# Patient Record
Sex: Male | Born: 1975 | Race: White | Hispanic: No | Marital: Married | Smoking: Never smoker
Health system: Southern US, Community
[De-identification: ages and names within clinical notes are randomized; demographics above are authoritative.]

---

## 2018-12-13 ENCOUNTER — Encounter (HOSPITAL_COMMUNITY): Payer: Self-pay | Admitting: Psychiatry

## 2018-12-13 ENCOUNTER — Other Ambulatory Visit (HOSPITAL_COMMUNITY): Payer: Self-pay | Admitting: Psychiatry

## 2018-12-13 ENCOUNTER — Ambulatory Visit (INDEPENDENT_AMBULATORY_CARE_PROVIDER_SITE_OTHER): Payer: BC Managed Care – PPO | Admitting: Psychiatry

## 2018-12-13 ENCOUNTER — Other Ambulatory Visit: Payer: Self-pay

## 2018-12-13 DIAGNOSIS — F3130 Bipolar disorder, current episode depressed, mild or moderate severity, unspecified: Secondary | ICD-10-CM | POA: Diagnosis not present

## 2018-12-13 DIAGNOSIS — K219 Gastro-esophageal reflux disease without esophagitis: Secondary | ICD-10-CM | POA: Diagnosis not present

## 2018-12-13 MED ORDER — LORAZEPAM 0.5 MG PO TABS: ORAL_TABLET | ORAL | 2 refills | Status: DC

## 2018-12-13 MED ORDER — OXCARBAZEPINE 150 MG PO TABS: 450.0000 mg | ORAL_TABLET | Freq: Two times a day (BID) | ORAL | 0 refills | Status: DC

## 2018-12-13 NOTE — Progress Notes (Signed)
BH MD/PA/NP OP Progress Note  12/13/2018 9:12 AM Travis Whitney  MRN:  [id number]  Chief Complaint:  Chief Complaint    Manic Behavior; Depression; Anxiety    Bipolar disorder HPI: Patient is seen and examined by phone.  Patient is a 43 year old male with a past psychiatric history significant for bipolar disorder.  I have followed the patient for the last 2 to 3 years.  He was last seen in [place] on 08/06/18. He contacted me by phone approximately 2 to 3 weeks ago, and was having a depressive episode.  He was currently taking Wellbutrin XL, Lamictal, and Trileptal. Adjustments were made in meds including. Ativan was added for prn sleep issues. Patient reports a big change.  He stated his mood was better and his sleep was improved.  He stated he did not use the Ativan most recently, and it has only been just as needed.  He denied any suicidal or homicidal ideation.  He denied any racing thoughts or pressured speech.  He stated that overall things were much better.  He denied any side effects to his current medications.  Visit Diagnosis: No diagnosis found.  Past Psychiatric History: My outpatient x 2 years. One previous hospitalization.  Past Medical History: History reviewed. No pertinent past medical history. History reviewed. No pertinent surgical history. History significant for GERD  Family Psychiatric History: non contributory  Family History: History reviewed. No pertinent family history.  Social History:  Social History   Socioeconomic History  . Marital status: Married    Spouse name: Not on file  . Number of children: Not on file  . Years of education: Not on file  . Highest education level: Not on file  Occupational History  . Not on file  Social Needs  . Financial resource strain: Not on file  . Food insecurity    Worry: Not on file    Inability: Not on file  . Transportation needs    Medical: Not on file    Non-medical: Not on file  Tobacco Use  . Smoking status:  Not on file  Substance and Sexual Activity  . Alcohol use: Not on file  . Drug use: Not on file  . Sexual activity: Not on file  Lifestyle  . Physical activity    Days per week: Not on file    Minutes per session: Not on file  . Stress: Not on file  Relationships  . Social Musicianconnections    Talks on phone: Not on file    Gets together: Not on file    Attends religious service: Not on file    Active member of club or organization: Not on file    Attends meetings of clubs or organizations: Not on file    Relationship status: Not on file  Other Topics Concern  . Not on file  Social History Narrative  . Not on file    Allergies: Not on File  Metabolic Disorder Labs: No results found for: HGBA1C, MPG No results found for: PROLACTIN No results found for: CHOL, TRIG, HDL, CHOLHDL, VLDL, LDLCALC No results found for: TSH  Therapeutic Level Labs: No results found for: LITHIUM No results found for: VALPROATE No components found for:  CBMZ  Current Medications: Current Outpatient Medications  Medication Sig Dispense Refill  . famotidine (PEPCID) 20 MG tablet TK 1 T PO BID PRN    . OXcarbazepine (TRILEPTAL) 150 MG tablet TK 3 TS PO QD AND 2 TS QHS    . pantoprazole (PROTONIX)  40 MG tablet TK 1 T PO QD    . buPROPion (WELLBUTRIN) 75 MG tablet TAKE 2 TABLET BY MOUTH EVERY DAY AND 1 TABLET BY MOUTH EVERY NIGHT    . lamoTRIgine (LAMICTAL) 200 MG tablet TK 1 T PO BID    . LORazepam (ATIVAN) 0.5 MG tablet TK 1 TO 2 TS PO HS PRF INSOMNIA     No current facility-administered medications for this visit.      Musculoskeletal: Strength & Muscle Tone: within normal limits Gait & Station: normal Patient leans: N/A  Psychiatric Specialty Exam: ROS  There were no vitals taken for this visit.There is no height or weight on file to calculate BMI.  General Appearance: NA  Eye Contact:  NA  Speech:  Normal Rate  Volume:  Normal  Mood:  Euthymic  Affect:  Congruent  Thought Process:   Coherent and Descriptions of Associations: Intact  Orientation:  Full (Time, Place, and Person)  Thought Content: Logical   Suicidal Thoughts:  No  Homicidal Thoughts:  No  Memory:  Immediate;   Fair Recent;   Fair Remote;   Fair  Judgement:  Intact  Insight:  Fair  Psychomotor Activity:  NA  Concentration:  Concentration: Good and Attention Span: Good  Recall:  Good  Fund of Knowledge: Good  Language: Good  Akathisia:  Negative  Handed:  Right  AIMS (if indicated): not done  Assets:  Communication Skills Desire for Improvement Housing Leisure Time Physical Health Resilience Social Support  ADL's:  Intact  Cognition: WNL  Sleep:  Good   Screenings:   Assessment and Plan: Patient is seen and examined.  Patient is a 43 year old male with a past psychiatric history significant for bipolar disorder; most recently mixed, moderate to severe without psychotic features.  He is doing much better with the medication changes that have been done.  He is currently taking Trileptal 450 mg p.o. twice daily, Lamictal 200 mg p.o. twice daily and has reduced his Wellbutrin dosage as well.  No change in his current medications.  He will return to the clinic in approximately 1 month. 1.  Continue Trileptal 450 mg p.o. twice daily for mood stability 2.  Continue Lamictal 200 mg p.o. twice daily for mood stability. 3.  Wellbutrin short acting for depression. 4.  Continue Protonix and Pepcid for GERD symptoms. 5.  Continue ativan 0.5 mg po q hs prn insomnia 6. Return to clinic in 1 month.   Sharma Covert, MD 12/13/2018, 9:12 AM

## 2019-01-03 ENCOUNTER — Ambulatory Visit (HOSPITAL_COMMUNITY): Payer: BC Managed Care – PPO | Admitting: Psychiatry

## 2019-03-10 ENCOUNTER — Other Ambulatory Visit (HOSPITAL_COMMUNITY): Payer: Self-pay | Admitting: Psychiatry

## 2019-03-10 MED ORDER — LORAZEPAM 0.5 MG PO TABS: ORAL_TABLET | ORAL | 2 refills | Status: DC

## 2019-04-14 ENCOUNTER — Other Ambulatory Visit (HOSPITAL_COMMUNITY): Payer: Self-pay | Admitting: Psychiatry

## 2019-04-14 MED ORDER — QUETIAPINE FUMARATE 50 MG PO TABS: ORAL_TABLET | ORAL | 2 refills | Status: DC

## 2019-04-18 ENCOUNTER — Other Ambulatory Visit: Payer: Self-pay

## 2019-04-18 ENCOUNTER — Ambulatory Visit (INDEPENDENT_AMBULATORY_CARE_PROVIDER_SITE_OTHER): Payer: BC Managed Care – PPO | Admitting: Psychiatry

## 2019-04-18 DIAGNOSIS — F3130 Bipolar disorder, current episode depressed, mild or moderate severity, unspecified: Secondary | ICD-10-CM

## 2019-04-18 NOTE — Progress Notes (Signed)
BH MD/PA/NP OP Progress Note  04/18/2019 8:56 AM Travis Whitney  MRN:  [id number]  Chief Complaint: Bipolar disorder, most recently depressed HPI: Patient is seen and examined by telephone visit today.  Patient is a 43 year old male well-known to me from my previous practice in [facility] with a history of bipolar disorder.  The patient called last week, was having a difficult time with sleep.  He had increased recent stressors secondary to his workplace as well as the fact that his mother is been hospitalized in [place] with coronavirus.  She has been there for over 20 days.  They have recommended intubation and ventilation at some point, but she is refused at this point and it sounds like she is on BiPAP.  Unfortunately they had to place a chest tube recently.  He has been unable to visit his mother because of the rules at the hospital over coronavirus issues.  Last week we started Seroquel 50 mg 1-2 tabs p.o. nightly because of the lack of sleep and concern for bipolar depression.  Shortly after he started this we discussed it over the telephone, and he was sleeping better with just 50 mg a day.  He stated he had not required the Ativan to help him sleep.  Today he stated he is still sleeping well, but somewhat hung over in the morning.  We discussed medication changes.  He stated his mood was a little bit better than it had been last week because the improvement in sleep.  He is matter-of-fact about his mother's condition, and is hoping that she recovers but she does have an underlying lung disease problem.  He denied any suicidal ideation at this point.  He also remains on Trileptal, Lamictal and Wellbutrin for his bipolar disorder.  He continues on Protonix for reflux disease, and Pepcid as needed twice daily for breakthrough reflux symptoms. Visit Diagnosis: No diagnosis found.  Past Psychiatric History: Noncontributory  Past Medical History: No past medical history on file. No past surgical  history on file.  Family Psychiatric History: Noncontributory  Family History: No family history on file.  Social History:  Social History   Socioeconomic History  . Marital status: Married    Spouse name: Not on file  . Number of children: Not on file  . Years of education: Not on file  . Highest education level: Not on file  Occupational History  . Not on file  Social Needs  . Financial resource strain: Not on file  . Food insecurity    Worry: Not on file    Inability: Not on file  . Transportation needs    Medical: Not on file    Non-medical: Not on file  Tobacco Use  . Smoking status: Not on file  Substance and Sexual Activity  . Alcohol use: Not on file  . Drug use: Not on file  . Sexual activity: Not on file  Lifestyle  . Physical activity    Days per week: Not on file    Minutes per session: Not on file  . Stress: Not on file  Relationships  . Social Musician on phone: Not on file    Gets together: Not on file    Attends religious service: Not on file    Active member of club or organization: Not on file    Attends meetings of clubs or organizations: Not on file    Relationship status: Not on file  Other Topics Concern  . Not on  file  Social History Narrative  . Not on file    Allergies: Not on File  Metabolic Disorder Labs: No results found for: HGBA1C, MPG No results found for: PROLACTIN No results found for: CHOL, TRIG, HDL, CHOLHDL, VLDL, LDLCALC No results found for: TSH  Therapeutic Level Labs: No results found for: LITHIUM No results found for: VALPROATE No components found for:  CBMZ  Current Medications: Current Outpatient Medications  Medication Sig Dispense Refill  . buPROPion (WELLBUTRIN) 75 MG tablet TAKE 2 TABLET BY MOUTH EVERY DAY AND 1 TABLET BY MOUTH EVERY NIGHT    . famotidine (PEPCID) 20 MG tablet TK 1 T PO BID PRN    . lamoTRIgine (LAMICTAL) 200 MG tablet TK 1 T PO BID    . LORazepam (ATIVAN) 0.5 MG tablet TK  1 TO 2 TS PO HS PRF INSOMNIA 30 tablet 2  . OXcarbazepine (TRILEPTAL) 150 MG tablet Take 3 tablets (450 mg total) by mouth 2 (two) times daily for 30 days. 180 tablet 0  . pantoprazole (PROTONIX) 40 MG tablet TK 1 T PO QD    . QUEtiapine (SEROQUEL) 50 MG tablet 1-2 tabs po q hs prn 60 tablet 2   No current facility-administered medications for this visit.      Musculoskeletal: Strength & Muscle Tone: NA Gait & Station: NA Patient leans: N/A  Psychiatric Specialty Exam: ROS  There were no vitals taken for this visit.There is no height or weight on file to calculate BMI.  General Appearance: NA  Eye Contact:  NA  Speech:  Normal Rate  Volume:  Normal  Mood:  Anxious  Affect:  Congruent  Thought Process:  Coherent and Descriptions of Associations: Intact  Orientation:  Full (Time, Place, and Person)  Thought Content: Logical   Suicidal Thoughts:  No  Homicidal Thoughts:  No  Memory:  Immediate;   Good Recent;   Good Remote;   Good  Judgement:  Intact  Insight:  Good  Psychomotor Activity:  NA  Concentration:  Concentration: Good and Attention Span: Good  Recall:  Good  Fund of Knowledge: Good  Language: Good  Akathisia:  Negative  Handed:  Right  AIMS (if indicated): done  Assets:  Communication Skills Desire for Improvement Financial Resources/Insurance Housing Resilience Social Support Talents/Skills Transportation  ADL's:  Intact  Cognition: WNL  Sleep:  Good   Screenings:   Assessment and Plan: Patient is seen and examined by telephone visit.  Patient is a 43 year old male with a past history of bipolar disorder, most recently depressed as well as reflux disease. 1.  Continue Seroquel 50 mg 1-2 tabs p.o. nightly for mood stability and sleep. 2.  Decrease Trileptal to 450 mg p.o. daily and 300 mg p.o. nightly for mood stability and anxiety. 3.  Continue short acting Wellbutrin 75 mg 2 tablets p.o. daily and 1 tab p.o. every afternoon for mood and  anxiety. 4.  Continue Lamictal 200 mg p.o. twice daily for mood stability. 5.  Continue Pepcid 20 mg p.o. twice daily as needed reflux disease. 6.  Continue Protonix 40 mg p.o. daily for reflux disease. 7.  Continue Ativan 0.5 mg p.o. daily and nightly as needed anxiety or insomnia. 8.  Return to clinic in 2 weeks.   Sharma Covert, MD 04/18/2019, 8:56 AM

## 2019-05-02 ENCOUNTER — Ambulatory Visit (INDEPENDENT_AMBULATORY_CARE_PROVIDER_SITE_OTHER): Payer: BC Managed Care – PPO | Admitting: Psychiatry

## 2019-05-02 ENCOUNTER — Other Ambulatory Visit: Payer: Self-pay

## 2019-05-02 DIAGNOSIS — K219 Gastro-esophageal reflux disease without esophagitis: Secondary | ICD-10-CM | POA: Diagnosis not present

## 2019-05-02 DIAGNOSIS — F3130 Bipolar disorder, current episode depressed, mild or moderate severity, unspecified: Secondary | ICD-10-CM | POA: Diagnosis not present

## 2019-05-02 MED ORDER — PANTOPRAZOLE SODIUM 40 MG PO TBEC: DELAYED_RELEASE_TABLET | ORAL | 5 refills | Status: DC

## 2019-05-02 MED ORDER — LORAZEPAM 0.5 MG PO TABS: ORAL_TABLET | ORAL | 2 refills | Status: DC

## 2019-05-02 MED ORDER — OXCARBAZEPINE 150 MG PO TABS: 450.0000 mg | ORAL_TABLET | Freq: Two times a day (BID) | ORAL | 3 refills | Status: DC

## 2019-05-02 MED ORDER — BUPROPION HCL 75 MG PO TABS: ORAL_TABLET | ORAL | 4 refills | Status: DC

## 2019-05-02 MED ORDER — LAMOTRIGINE 200 MG PO TABS: ORAL_TABLET | ORAL | 4 refills | Status: DC

## 2019-05-02 MED ORDER — FAMOTIDINE 20 MG PO TABS: ORAL_TABLET | ORAL | 3 refills | Status: DC

## 2019-05-02 MED ORDER — QUETIAPINE FUMARATE 50 MG PO TABS: ORAL_TABLET | ORAL | 2 refills | Status: DC

## 2019-05-02 NOTE — Progress Notes (Signed)
[place] MD/PA/NP OP Progress Note  05/02/2019 9:06 AM Travis Whitney  MRN:  [id number]  Chief Complaint: Bipolar disorder HPI: Patient is seen and examined.  Patient is a 43 year old male with a past psychiatric history significant for bipolar disorder type I, most recently depressed.  He is seen in contact by telephone today.  The patient stated he is doing well.  Since we added the Seroquel for sleep he is improved significantly.  His mood is good, and his sleep is good.  He stated his mother is out of the ICU from [place], and is now in a rehabilitation facility in [place].  She is not on a ventilator, but she still being weaned from BiPAP.  He stated he felt as though her prognosis was better.  He stated he is working 3 days a week, and that is hard, but it is much better than it had been.  His financial stressors are minimal as well.  He and his family are getting along better now.  He denied any racing thoughts, no evidence of pressured speech.  Sleep is good.  Overall he rated himself as a 9 out of 10.  He denied any side effects to his current medications. Visit Diagnosis: No diagnosis found.  Past Psychiatric History: See initial evaluation.  Past Medical History: No past medical history on file. No past surgical history on file.  Family Psychiatric History: See initial evaluation  Family History: No family history on file.  Social History:  Social History   Socioeconomic History  . Marital status: Married    Spouse name: Not on file  . Number of children: Not on file  . Years of education: Not on file  . Highest education level: Not on file  Occupational History  . Not on file  Social Needs  . Financial resource strain: Not on file  . Food insecurity    Worry: Not on file    Inability: Not on file  . Transportation needs    Medical: Not on file    Non-medical: Not on file  Tobacco Use  . Smoking status: Not on file  Substance and Sexual Activity  . Alcohol use: Not on  file  . Drug use: Not on file  . Sexual activity: Not on file  Lifestyle  . Physical activity    Days per week: Not on file    Minutes per session: Not on file  . Stress: Not on file  Relationships  . Social Herbalist on phone: Not on file    Gets together: Not on file    Attends religious service: Not on file    Active member of club or organization: Not on file    Attends meetings of clubs or organizations: Not on file    Relationship status: Not on file  Other Topics Concern  . Not on file  Social History Narrative  . Not on file    Allergies: Not on File  Metabolic Disorder Labs: No results found for: HGBA1C, MPG No results found for: PROLACTIN No results found for: CHOL, TRIG, HDL, CHOLHDL, VLDL, LDLCALC No results found for: TSH  Therapeutic Level Labs: No results found for: LITHIUM No results found for: VALPROATE No components found for:  CBMZ  Current Medications: Current Outpatient Medications  Medication Sig Dispense Refill  . buPROPion (WELLBUTRIN) 75 MG tablet TAKE 2 TABLET BY MOUTH EVERY DAY AND 1 TABLET BY MOUTH EVERY NIGHT    . famotidine (PEPCID) 20 MG tablet  TK 1 T PO BID PRN    . lamoTRIgine (LAMICTAL) 200 MG tablet TK 1 T PO BID    . LORazepam (ATIVAN) 0.5 MG tablet TK 1 TO 2 TS PO HS PRF INSOMNIA 30 tablet 2  . OXcarbazepine (TRILEPTAL) 150 MG tablet Take 3 tablets (450 mg total) by mouth 2 (two) times daily for 30 days. 180 tablet 0  . pantoprazole (PROTONIX) 40 MG tablet TK 1 T PO QD    . QUEtiapine (SEROQUEL) 50 MG tablet 1-2 tabs po q hs prn 60 tablet 2   No current facility-administered medications for this visit.      Musculoskeletal: Strength & Muscle Tone: NA Gait & Station: NA Patient leans: N/A  Psychiatric Specialty Exam: ROS  There were no vitals taken for this visit.There is no height or weight on file to calculate BMI.  General Appearance: NA  Eye Contact:  NA  Speech:  Normal Rate  Volume:  Normal  Mood:   Euthymic  Affect:  Congruent  Thought Process:  Coherent and Descriptions of Associations: Intact  Orientation:  Full (Time, Place, and Person)  Thought Content: Logical   Suicidal Thoughts:  No  Homicidal Thoughts:  No  Memory:  Immediate;   Good Recent;   Good Remote;   Good  Judgement:  Good  Insight:  Good  Psychomotor Activity:  NA  Concentration:  Concentration: Good and Attention Span: Good  Recall:  Good  Fund of Knowledge: Good  Language: Good  Akathisia:  Negative  Handed:  Right  AIMS (if indicated): not done  Assets:  Communication Skills Desire for Improvement Financial Resources/Insurance Housing Intimacy Resilience Social Support Talents/Skills Transportation Vocational/Educational  ADL's:  Intact  Cognition: WNL  Sleep:  Good   Screenings:   Assessment and Plan: Patient is seen and examined.  Patient is a 43 year old male with a past psychiatric history significant for bipolar disorder; most recently depressed, severe without psychotic features.  Since the addition of the Seroquel he is doing very well.  No changes medications.  I will see him back in approximately 4 to 6 weeks.  This visit was by telephone for 25 minutes, and greater than 50% of the visit was spent with the patient's examination. 1.  Continue Wellbutrin 150 mg p.o. daily and 75 mg p.o. every afternoon for depression. 2.  Continue famotidine 20 mg p.o. twice daily for reflux disease. 3.  Continue Lamictal 200 mg p.o. twice daily for mood stability. 4.  Continue lorazepam 0.5 mg p.o. twice daily as needed anxiety or insomnia. 5.  Continue Trileptal 150 mg 300 mg p.o. twice daily for mood stability. 7.  Continue Protonix 40 mg p.o. daily for reflux disease. 8.  Continue Seroquel 50 mg 1-2 tabs p.o. nightly insomnia. 9.  Return to clinic in 4 to 6 weeks.   Antonieta Pert, MD 05/02/2019, 9:06 AM

## 2019-05-13 ENCOUNTER — Other Ambulatory Visit (HOSPITAL_COMMUNITY): Payer: Self-pay | Admitting: Psychiatry

## 2019-05-16 ENCOUNTER — Encounter (HOSPITAL_COMMUNITY): Payer: Self-pay | Admitting: Psychiatry

## 2019-06-13 ENCOUNTER — Ambulatory Visit (HOSPITAL_COMMUNITY): Payer: BC Managed Care – PPO | Admitting: Psychiatry

## 2019-06-13 ENCOUNTER — Other Ambulatory Visit: Payer: Self-pay

## 2019-06-27 ENCOUNTER — Other Ambulatory Visit: Payer: Self-pay

## 2019-06-27 ENCOUNTER — Ambulatory Visit (INDEPENDENT_AMBULATORY_CARE_PROVIDER_SITE_OTHER): Payer: BC Managed Care – PPO | Admitting: Psychiatry

## 2019-06-27 DIAGNOSIS — K219 Gastro-esophageal reflux disease without esophagitis: Secondary | ICD-10-CM | POA: Diagnosis not present

## 2019-06-27 DIAGNOSIS — F3130 Bipolar disorder, current episode depressed, mild or moderate severity, unspecified: Secondary | ICD-10-CM | POA: Diagnosis not present

## 2019-06-27 MED ORDER — LAMOTRIGINE 200 MG PO TABS: ORAL_TABLET | ORAL | 4 refills | Status: DC

## 2019-06-27 MED ORDER — FAMOTIDINE 20 MG PO TABS: ORAL_TABLET | ORAL | 3 refills | Status: DC

## 2019-06-27 MED ORDER — PANTOPRAZOLE SODIUM 40 MG PO TBEC: DELAYED_RELEASE_TABLET | ORAL | 5 refills | Status: DC

## 2019-06-27 MED ORDER — OXCARBAZEPINE 150 MG PO TABS: ORAL_TABLET | ORAL | 3 refills | Status: DC

## 2019-06-27 MED ORDER — BUPROPION HCL 75 MG PO TABS: ORAL_TABLET | ORAL | 4 refills | Status: DC

## 2019-06-27 MED ORDER — QUETIAPINE FUMARATE 100 MG PO TABS: ORAL_TABLET | ORAL | 3 refills | Status: DC

## 2019-06-27 NOTE — Progress Notes (Signed)
[place] MD/PA/NP OP Progress Note  06/27/2019 7:59 AM Travis Whitney  MRN:  [id number]  Chief Complaint: Bipolar depression HPI: Patient is seen and examined.  Patient is seen in a telephone visit today of which greater than 50% was spent on direct patient care.  Patient stated he is doing much better.  Since we added the Seroquel his mood stability has been much better.  He stated that the Seroquel is taking about 3 to 4 hours before it really kicks in, and then he is able to sleep fairly well.  He stated he is taking the Seroquel about 6 PM.  We discussed potentially increasing that dosage so that he can sleep a bit better.  Otherwise the rest of his medications have been stable.  He has not had any illnesses with regard to COVID-19.  We discussed the vaccination process, and the fact that his stepdaughter has diabetes and is at high risk.  Additionally his wife is a Government social research officer, and she is having to go to some the students homes and that puts her at high risk as well.  His mood is good, sleep as per above, and he denied any side effects to his current medications.  He continues to take the Pepcid as well as the Protonix and does have some occasional breakthrough heartburn.  No other complaints. Visit Diagnosis: No diagnosis found.  Past Psychiatric History: See admission H&P  Past Medical History: No past medical history on file. No past surgical history on file.  Family Psychiatric History: See admission H&P  Family History: No family history on file.  Social History:  Social History   Socioeconomic History  . Marital status: Married    Spouse name: Not on file  . Number of children: Not on file  . Years of education: Not on file  . Highest education level: Not on file  Occupational History  . Not on file  Tobacco Use  . Smoking status: Never Smoker  Substance and Sexual Activity  . Alcohol use: Yes  . Drug use: Never  . Sexual activity: Yes    Partners: Female  Other Topics Concern   . Not on file  Social History Narrative  . Not on file   Social Determinants of Health   Financial Resource Strain:   . Difficulty of Paying Living Expenses: Not on file  Food Insecurity:   . Worried About Charity fundraiser in the Last Year: Not on file  . Ran Out of Food in the Last Year: Not on file  Transportation Needs:   . Lack of Transportation (Medical): Not on file  . Lack of Transportation (Non-Medical): Not on file  Physical Activity:   . Days of Exercise per Week: Not on file  . Minutes of Exercise per Session: Not on file  Stress:   . Feeling of Stress : Not on file  Social Connections:   . Frequency of Communication with Friends and Family: Not on file  . Frequency of Social Gatherings with Friends and Family: Not on file  . Attends Religious Services: Not on file  . Active Member of Clubs or Organizations: Not on file  . Attends Archivist Meetings: Not on file  . Marital Status: Not on file    Allergies: Not on File  Metabolic Disorder Labs: No results found for: HGBA1C, MPG No results found for: PROLACTIN No results found for: CHOL, TRIG, HDL, CHOLHDL, VLDL, LDLCALC No results found for: TSH  Therapeutic Level Labs: No  results found for: LITHIUM No results found for: VALPROATE No components found for:  CBMZ  Current Medications: Current Outpatient Medications  Medication Sig Dispense Refill  . buPROPion (WELLBUTRIN) 75 MG tablet TAKE 2 TABLET BY MOUTH EVERY DAY AND 1 TABLET BY MOUTH EVERY NIGHT 90 tablet 4  . famotidine (PEPCID) 20 MG tablet TK 1 T PO BID PRN 60 tablet 3  . lamoTRIgine (LAMICTAL) 200 MG tablet TK 1 T PO BID 60 tablet 4  . LORazepam (ATIVAN) 0.5 MG tablet TAKE 1 TO 2 TABLETS BY MOUTH EVERY NIGHT AT BEDTIME AS NEEDED FOR INSOMNIA 30 tablet 2  . OXcarbazepine (TRILEPTAL) 150 MG tablet Take 3 tablets (450 mg total) by mouth 2 (two) times daily. 180 tablet 3  . pantoprazole (PROTONIX) 40 MG tablet TK 1 T PO QD 30 tablet 5  .  QUEtiapine (SEROQUEL) 50 MG tablet 1-2 tabs po q hs prn 60 tablet 2   No current facility-administered medications for this visit.     Musculoskeletal: Strength & Muscle Tone: NA Gait & Station: NA Patient leans: N/A  Psychiatric Specialty Exam: Review of Systems  There were no vitals taken for this visit.There is no height or weight on file to calculate BMI.  General Appearance: NA  Eye Contact:  NA  Speech:  Normal Rate  Volume:  Normal  Mood:  Euthymic  Affect:  Congruent  Thought Process:  Coherent and Descriptions of Associations: Intact  Orientation:  Full (Time, Place, and Person)  Thought Content: Logical   Suicidal Thoughts:  No  Homicidal Thoughts:  No  Memory:  Immediate;   Good Recent;   Good Remote;   Good  Judgement:  Intact  Insight:  Good  Psychomotor Activity:  Normal  Concentration:  Concentration: Good and Attention Span: Good  Recall:  Good  Fund of Knowledge: Good  Language: Good  Akathisia:  Negative  Handed:  Right  AIMS (if indicated): not done  Assets:  Communication Skills Desire for Improvement Financial Resources/Insurance Housing Intimacy Physical Health Resilience Social Support Talents/Skills Transportation Vocational/Educational  ADL's:  Intact  Cognition: WNL  Sleep:  Fair   Screenings:   Assessment and Plan: Patient is seen and examined in a telephone visit today.  He is doing well with regard to his mood.  His sleep is a bit still out of whack.  We will increase his Seroquel 200 mg tabs and he can take between 1-2 tabs p.o. nightly.  No change in his Trileptal, Wellbutrin, Pepcid or Protonix.  We discussed the COVID-19 vaccine, and I have recommended for him to get it when it becomes available.  He will return to see me in 3 months. 1.  Increase Seroquel to 100 mg 1-2 tabs p.o. nightly for mood stability and sleep. 2.  Continue Trileptal 450 mg p.o. daily and 300 mg p.o. nightly for mood stability and anxiety. 3.  Continue  short acting Wellbutrin 75 mg 2 tabs p.o. daily and 1 tab p.o. every afternoon for mood and anxiety. 4.  Continue Lamictal 200 mg p.o. twice daily for mood stability. 5.  Continue Pepcid 20 mg p.o. twice daily for reflux 6.  Continue Protonix 40 mg p.o. daily for reflux disease. 7.  Continue Ativan 0.5 mg p.o. daily as needed and nightly as needed for anxiety and sleep. 8.  Return to clinic in 3 to 4 months.   Antonieta Pert, MD 06/27/2019, 7:59 AM

## 2019-09-19 ENCOUNTER — Ambulatory Visit (INDEPENDENT_AMBULATORY_CARE_PROVIDER_SITE_OTHER): Payer: BC Managed Care – PPO | Admitting: Psychiatry

## 2019-09-19 ENCOUNTER — Other Ambulatory Visit: Payer: Self-pay

## 2019-09-19 DIAGNOSIS — F3130 Bipolar disorder, current episode depressed, mild or moderate severity, unspecified: Secondary | ICD-10-CM | POA: Diagnosis not present

## 2019-09-19 DIAGNOSIS — K219 Gastro-esophageal reflux disease without esophagitis: Secondary | ICD-10-CM | POA: Diagnosis not present

## 2019-09-19 DIAGNOSIS — F411 Generalized anxiety disorder: Secondary | ICD-10-CM | POA: Diagnosis not present

## 2019-09-19 MED ORDER — QUETIAPINE FUMARATE 100 MG PO TABS: ORAL_TABLET | ORAL | 2 refills | Status: DC

## 2019-09-19 MED ORDER — LORAZEPAM 0.5 MG PO TABS: ORAL_TABLET | ORAL | 0 refills | Status: DC

## 2019-09-19 NOTE — Progress Notes (Signed)
BH MD/PA/NP OP Progress Note  09/19/2019 8:17 AM Travis Whitney  MRN:  [id number]  Chief Complaint: Bipolar depression HPI: Patient is seen and examined.  Patient was identified by 2 factor identification process.  Patient is seen in a telephone visit today of which greater than 50% of the time was spent on direct patient care.  Patient stated has been under significant stress recently.  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  He is working some days 11 PM to 2 AM.  He stated he has had increased anxiety, and as well his sleep is not doing as well.  He is getting about 5 hours a night.  He stated that when he is working during the day he also has some anxiety attacks which causes him to pull over and to relax before he can continue to drive.  His wife encouraged him to take the lorazepam during the day for the anxiety, but he felt uncomfortable with that until he discussed it with me.  He denied any suicidal ideation, but did state that he was under a great deal of stress and felt more anxious and somewhat more depressed. Visit Diagnosis: No diagnosis found.  Past Psychiatric History: See admission H&P  Past Medical History: No past medical history on file. No past surgical history on file.  Family Psychiatric History: See admission H&P  Family History: No family history on file.  Social History:  Social History   Socioeconomic History  . Marital status: Married    Spouse name: Not on file  . Number of children: Not on file  . Years of education: Not on file  . Highest education level: Not on file  Occupational History  . Not on file  Tobacco Use  . Smoking status: Never Smoker  Substance and Sexual Activity  . Alcohol use: Yes  . Drug use: Never  . Sexual activity: Yes    Partners: Female  Other Topics Concern  . Not on file  Social History Narrative  . Not on file   Social Determinants of Health   Financial Resource Strain:   .  Difficulty of Paying Living Expenses:   Food Insecurity:   . Worried About Programme researcher, broadcasting/film/video in the Last Year:   . Barista in the Last Year:   Transportation Needs:   . Freight forwarder (Medical):   Marland Kitchen Lack of Transportation (Non-Medical):   Physical Activity:   . Days of Exercise per Week:   . Minutes of Exercise per Session:   Stress:   . Feeling of Stress :   Social Connections:   . Frequency of Communication with Friends and Family:   . Frequency of Social Gatherings with Friends and Family:   . Attends Religious Services:   . Active Member of Clubs or Organizations:   . Attends Banker Meetings:   Marland Kitchen Marital Status:     Allergies: Not on File  Metabolic Disorder Labs: No results found for: HGBA1C, MPG No results found for: PROLACTIN No results found for: CHOL, TRIG, HDL, CHOLHDL, VLDL, LDLCALC No results found for: TSH  Therapeutic Level Labs: No results found for: LITHIUM No results found for: VALPROATE No components found for:  CBMZ  Current Medications: Current Outpatient Medications  Medication Sig Dispense Refill  . buPROPion (WELLBUTRIN) 75 MG tablet TAKE 2 TABLET BY MOUTH EVERY DAY AND 1 TABLET BY MOUTH EVERY NIGHT 90 tablet 4  .  famotidine (PEPCID) 20 MG tablet TK 1 T PO BID PRN 60 tablet 3  . lamoTRIgine (LAMICTAL) 200 MG tablet TK 1 T PO BID 60 tablet 4  . LORazepam (ATIVAN) 0.5 MG tablet One tab po BID prn anxiety and 1 tab po q hs prn insomnia 60 tablet 0  . OXcarbazepine (TRILEPTAL) 150 MG tablet 2 tabs po q day and 3 tabs po q hs 150 tablet 3  . pantoprazole (PROTONIX) 40 MG tablet TK 1 T PO QD 30 tablet 5  . QUEtiapine (SEROQUEL) 100 MG tablet One and one half to two tabs po q hs 60 tablet 2   No current facility-administered medications for this visit.     Musculoskeletal: Strength & Muscle Tone: NA Gait & Station: NA Patient leans: N/A  Psychiatric Specialty Exam: Review of Systems  There were no vitals taken for  this visit.There is no height or weight on file to calculate BMI.  General Appearance: NA  Eye Contact:  NA  Speech:  Normal Rate  Volume:  Normal  Mood:  Anxious  Affect:  Congruent  Thought Process:  Coherent and Descriptions of Associations: Intact  Orientation:  Full (Time, Place, and Person)  Thought Content: Logical   Suicidal Thoughts:  No  Homicidal Thoughts:  No  Memory:  Immediate;   Good Recent;   Good Remote;   Good  Judgement:  Intact  Insight:  Good  Psychomotor Activity:  NA  Concentration:  Concentration: Good and Attention Span: Good  Recall:  Good  Fund of Knowledge: Good  Language: Good  Akathisia:  NA  Handed:  Right  AIMS (if indicated): not done  Assets:  Communication Skills Desire for Improvement Financial Resources/Insurance Housing Physical Health  ADL's:  Intact  Cognition: WNL  Sleep:  Fair   Screenings:   Assessment and Plan: Patient is seen and examined in a telephone visit today.  2 factor identification was done.  Greater than 50% of the time was spent on direct patient care.  He is much more anxious and somewhat more depressed given his business situation.  His sleep is not great, so I told him to take either 1-1/2-2 of the 100 mg Seroquel tablets.  I am also going to increase his lorazepam to 0.5 mg p.o. twice daily as needed anxiety and 1 tablet p.o. nightly as needed insomnia.  I told him I want to get his sleep to the point where he does not require the lorazepam.  No change in the Trileptal, Wellbutrin, Pepcid or Protonix at this point.  He will call me or text me sometime in the next 4 days to let me know how things are going, and in the meantime I am going to schedule him for follow-up in 2 weeks.  1.  Increase Seroquel to 100 mg 1 and one half to 2 tabs p.o. nightly for mood stability and sleep. 2.  Continue Trileptal 450 mg p.o. daily and 300 mg p.o. nightly for mood stability and anxiety. 3.  Continue short acting Wellbutrin 75 mg 2  tabs p.o. daily and 1 tab p.o. every afternoon for mood and anxiety. 4.  Continue Lamictal 200 mg p.o. twice daily for mood stability. 5.  Continue Pepcid 20 mg p.o. twice daily for reflux 6.  Continue Protonix 40 mg p.o. daily for reflux disease. 7.  Increase Ativan 0.5 mg p.o. BID anxiety as needed and nightly as needed for anxiety and sleep. 8.  Return to clinic in 2 weeks.  Sharma Covert, MD 09/19/2019, 8:17 AM

## 2019-10-03 ENCOUNTER — Ambulatory Visit (INDEPENDENT_AMBULATORY_CARE_PROVIDER_SITE_OTHER): Payer: BC Managed Care – PPO | Admitting: Psychiatry

## 2019-10-03 ENCOUNTER — Other Ambulatory Visit: Payer: Self-pay

## 2019-10-03 DIAGNOSIS — F3130 Bipolar disorder, current episode depressed, mild or moderate severity, unspecified: Secondary | ICD-10-CM | POA: Diagnosis not present

## 2019-10-03 DIAGNOSIS — F411 Generalized anxiety disorder: Secondary | ICD-10-CM

## 2019-10-03 DIAGNOSIS — K219 Gastro-esophageal reflux disease without esophagitis: Secondary | ICD-10-CM | POA: Diagnosis not present

## 2019-10-03 NOTE — Progress Notes (Signed)
BH MD/PA/NP OP Progress Note  10/03/2019 9:09 AM Travis Whitney  MRN:  [id number]  Chief Complaint: Bipolar disorder, most recently depressed, generalized anxiety disorder HPI: Patient is seen and examined.  Patient was identified by 2 factor identification process.  Patient is seen in a telephone visit today of which greater than 50% of the time was spent on direct patient care.  Patient stated that he has been under a great deal of stress recently.  His wife attempted to harm her self on 2 occasions.  She attempted to take an overdose, and then also attempted to harm her self with a pistol.  She did not know how to use the pistol likely.  She had been seeing a nurse practitioner or PA locally, and she ended up being hospitalized.  Apparently local practitioner placed her on multiple medications at one time for restless leg syndrome.  After a couple of days in the hospital when those medications were stopped her mood improved.  They have placed her on Trintellix.  He stated that prior to that point the changes medications have been effective.  He stated that the increase in Seroquel 200 mg 1-1/2 tablets p.o. nightly was effective.  He stated that after his wife came home things improved significantly.  He stated that he slept 9 hours last night on 1-1/2 tablets.  He rated his mood currently at 8 out of 10 where 10 out of 10 is normal.  He denied any manic symptoms.  He denied any suicidal or homicidal ideation of his own.  He denied any side effects to his current medications.  He stated he still only taking the lorazepam once or twice a day currently. Visit Diagnosis: No diagnosis found.  Past Psychiatric History: See initial evaluation.  Past Medical History: No past medical history on file. No past surgical history on file.  Family Psychiatric History: See initial evaluation.  Family History: No family history on file.  Social History:  Social History   Socioeconomic History  . Marital status:  Married    Spouse name: Not on file  . Number of children: Not on file  . Years of education: Not on file  . Highest education level: Not on file  Occupational History  . Not on file  Tobacco Use  . Smoking status: Never Smoker  Substance and Sexual Activity  . Alcohol use: Yes  . Drug use: Never  . Sexual activity: Yes    Partners: Female  Other Topics Concern  . Not on file  Social History Narrative  . Not on file   Social Determinants of Health   Financial Resource Strain:   . Difficulty of Paying Living Expenses:   Food Insecurity:   . Worried About Programme researcher, broadcasting/film/video in the Last Year:   . Barista in the Last Year:   Transportation Needs:   . Freight forwarder (Medical):   Marland Kitchen Lack of Transportation (Non-Medical):   Physical Activity:   . Days of Exercise per Week:   . Minutes of Exercise per Session:   Stress:   . Feeling of Stress :   Social Connections:   . Frequency of Communication with Friends and Family:   . Frequency of Social Gatherings with Friends and Family:   . Attends Religious Services:   . Active Member of Clubs or Organizations:   . Attends Banker Meetings:   Marland Kitchen Marital Status:     Allergies: Not on File  Metabolic Disorder  Labs: No results found for: HGBA1C, MPG No results found for: PROLACTIN No results found for: CHOL, TRIG, HDL, CHOLHDL, VLDL, LDLCALC No results found for: TSH  Therapeutic Level Labs: No results found for: LITHIUM No results found for: VALPROATE No components found for:  CBMZ  Current Medications: Current Outpatient Medications  Medication Sig Dispense Refill  . buPROPion (WELLBUTRIN) 75 MG tablet TAKE 2 TABLET BY MOUTH EVERY DAY AND 1 TABLET BY MOUTH EVERY NIGHT 90 tablet 4  . famotidine (PEPCID) 20 MG tablet TK 1 T PO BID PRN 60 tablet 3  . lamoTRIgine (LAMICTAL) 200 MG tablet TK 1 T PO BID 60 tablet 4  . LORazepam (ATIVAN) 0.5 MG tablet One tab po BID prn anxiety and 1 tab po q hs prn  insomnia 60 tablet 0  . OXcarbazepine (TRILEPTAL) 150 MG tablet 2 tabs po q day and 3 tabs po q hs 150 tablet 3  . pantoprazole (PROTONIX) 40 MG tablet TK 1 T PO QD 30 tablet 5  . QUEtiapine (SEROQUEL) 100 MG tablet One and one half to two tabs po q hs 60 tablet 2   No current facility-administered medications for this visit.     Musculoskeletal: Strength & Muscle Tone: NA Gait & Station: NA Patient leans: N/A  Psychiatric Specialty Exam: Review of Systems  There were no vitals taken for this visit.There is no height or weight on file to calculate BMI.  General Appearance: NA  Eye Contact:  NA  Speech:  Normal Rate  Volume:  Normal  Mood:  Euthymic  Affect:  Congruent  Thought Process:  Coherent and Descriptions of Associations: Intact  Orientation:  Full (Time, Place, and Person)  Thought Content: Logical   Suicidal Thoughts:  No  Homicidal Thoughts:  No  Memory:  Immediate;   Good Recent;   Good Remote;   Good  Judgement:  Intact  Insight:  Fair  Psychomotor Activity:  NA  Concentration:  Concentration: Good and Attention Span: Good  Recall:  Good  Fund of Knowledge: Good  Language: Good  Akathisia:  Negative  Handed:  Right  AIMS (if indicated): not done  Assets:  Communication Skills Desire for Improvement Financial Resources/Insurance Housing Intimacy Physical Health Resilience Social Support Talents/Skills Transportation  ADL's:  Intact  Cognition: WNL  Sleep:  Good   Screenings:   Assessment and Plan: Patient is seen and examined in a telephone visit today.  2 factor identification was done.  Greater than 50% of the 25-minute visit was spent on direct patient care.  Despite significant stressors including his wife's attempted suicide and psychiatric hospitalization he is done fairly well.  Prior to the event and after he has been sleeping well.  He is not showing any signs or symptoms of mania with the Wellbutrin dose, and the increase in his Seroquel  is doing well with his sleep.  No changes medications.  Given the additional stressors in his life I will see him back in 2 weeks and make sure that things continue to do well.  1. Continue Seroquel to 100 mg 1 and one half to 2 tabs p.o. nightly for mood stability and sleep. 2. Continue Trileptal 450 mg p.o. daily and 300 mg p.o. nightly for mood stability and anxiety. 3. Continue short acting Wellbutrin 75 mg 2 tabs p.o. daily and 1 tab p.o. every afternoon for mood and anxiety. 4. Continue Lamictal 200 mg p.o. twice daily for mood stability. 5. Continue Pepcid 20 mg p.o. twice daily  for reflux 6. Continue Protonix 40 mg p.o. daily for reflux disease. 7. Continue Ativan 0.5 mg p.o. BID anxiety as needed and nightly as needed for anxiety and sleep. 8. Return to clinic in 2 weeks.    Sharma Covert, MD 10/03/2019, 9:09 AM

## 2019-10-17 ENCOUNTER — Other Ambulatory Visit: Payer: Self-pay

## 2019-10-17 ENCOUNTER — Telehealth (INDEPENDENT_AMBULATORY_CARE_PROVIDER_SITE_OTHER): Payer: BC Managed Care – PPO | Admitting: Psychiatry

## 2019-10-17 DIAGNOSIS — F411 Generalized anxiety disorder: Secondary | ICD-10-CM | POA: Diagnosis not present

## 2019-10-17 DIAGNOSIS — F3110 Bipolar disorder, current episode manic without psychotic features, unspecified: Secondary | ICD-10-CM

## 2019-10-17 NOTE — Progress Notes (Signed)
BH MD/PA/NP OP Progress Note  10/17/2019 1:56 PM Travis Whitney  MRN:  [id number]  Chief Complaint: Bipolar disorder, generalized anxiety disorder, insomnia. HPI: Patient is seen and examined.  Patient is a 44 year old 44 year old male with the above-stated past psychiatric history is seen in follow-up.  This is a telephone visit today, and a 2 factor identification process was done.  Greater than 50% of the visit today was spent on direct patient care and evaluation.  Patient stated he is doing well since last visit.  His sleep has remained stable on approximately 150 mg of Seroquel at bedtime.  His anxiety is under well control also.  His wife has had no further issues with psychiatric problems, and that has relieved distress.  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  He denied any side effects to his current medications.  His mood is good.  His sleep is good.  He denied any suicidal or homicidal ideation.  Visit Diagnosis: No diagnosis found.  Past Psychiatric History: See initial evaluation.  Past Medical History: No past medical history on file. No past surgical history on file.  Family Psychiatric History: See initial evaluation.  Family History: No family history on file.  Social History:  Social History   Socioeconomic History  . Marital status: Married    Spouse name: Not on file  . Number of children: Not on file  . Years of education: Not on file  . Highest education level: Not on file  Occupational History  . Not on file  Tobacco Use  . Smoking status: Never Smoker  Substance and Sexual Activity  . Alcohol use: Yes  . Drug use: Never  . Sexual activity: Yes    Partners: Female  Other Topics Concern  . Not on file  Social History Narrative  . Not on file   Social Determinants of Health   Financial Resource Strain:   . Difficulty of Paying Living Expenses:   Food Insecurity:    . Worried About Charity fundraiser in the Last Year:   . Arboriculturist in the Last Year:   Transportation Needs:   . Film/video editor (Medical):   Marland Kitchen Lack of Transportation (Non-Medical):   Physical Activity:   . Days of Exercise per Week:   . Minutes of Exercise per Session:   Stress:   . Feeling of Stress :   Social Connections:   . Frequency of Communication with Friends and Family:   . Frequency of Social Gatherings with Friends and Family:   . Attends Religious Services:   . Active Member of Clubs or Organizations:   . Attends Archivist Meetings:   Marland Kitchen Marital Status:     Allergies: Not on File  Metabolic Disorder Labs: No results found for: HGBA1C, MPG No results found for: PROLACTIN No results found for: CHOL, TRIG, HDL, CHOLHDL, VLDL, LDLCALC No results found for: TSH  Therapeutic Level Labs: No results found for: LITHIUM No results found for: VALPROATE No components found for:  CBMZ  Current Medications: Current Outpatient Medications  Medication Sig Dispense Refill  . buPROPion (WELLBUTRIN) 75 MG tablet TAKE 2 TABLET BY MOUTH EVERY DAY AND 1 TABLET BY MOUTH EVERY NIGHT 90 tablet 4  . famotidine (PEPCID) 20 MG tablet TK 1 T PO BID PRN 60 tablet 3  . lamoTRIgine (LAMICTAL) 200 MG tablet TK 1 T PO BID  60 tablet 4  . LORazepam (ATIVAN) 0.5 MG tablet One tab po BID prn anxiety and 1 tab po q hs prn insomnia 60 tablet 0  . OXcarbazepine (TRILEPTAL) 150 MG tablet 2 tabs po q day and 3 tabs po q hs 150 tablet 3  . pantoprazole (PROTONIX) 40 MG tablet TK 1 T PO QD 30 tablet 5  . QUEtiapine (SEROQUEL) 100 MG tablet One and one half to two tabs po q hs 60 tablet 2   No current facility-administered medications for this visit.     Musculoskeletal: Strength & Muscle Tone: NA Gait & Station: NA Patient leans: N/A  Psychiatric Specialty Exam: Review of Systems  There were no vitals taken for this visit.There is no height or weight on file to  calculate BMI.  General Appearance: NA  Eye Contact:  NA  Speech:  Normal Rate  Volume:  Normal  Mood:  Euthymic  Affect:  Congruent  Thought Process:  Coherent and Descriptions of Associations: Intact  Orientation:  Full (Time, Place, and Person)  Thought Content: Logical   Suicidal Thoughts:  No  Homicidal Thoughts:  No  Memory:  Immediate;   Good Recent;   Good Remote;   Good  Judgement:  Intact  Insight:  Good  Psychomotor Activity:  NA  Concentration:  Concentration: Good and Attention Span: Good  Recall:  Good  Fund of Knowledge: Good  Language: Good  Akathisia:  NA  Handed:  Right  AIMS (if indicated): not done  Assets:  Communication Skills Desire for Improvement Financial Resources/Insurance Housing Resilience Social Support Talents/Skills Transportation Vocational/Educational  ADL's:  Intact  Cognition: WNL  Sleep:  Good   Screenings:   Assessment and Plan: Patient is seen and examined.  Patient is a 44 year old male with the above-stated past psychiatric history is seen in follow-up.  He is doing well.  This was a telephone visit today of which greater than 50% of the 15-minute visit was spent on direct patient care and evaluation.  No change in his current medications.  I will keep a close eye on things given his deterioration previously, and will see him back in approximately 4 weeks.  1. Continue Seroquel to 100 mg 1and one half to2 tabs p.o. nightly for mood stability and sleep. 2. Continue Trileptal 450 mg p.o. daily and 300 mg p.o. nightly for mood stability and anxiety. 3. Continue short acting Wellbutrin 75 mg 2 tabs p.o. daily and 1 tab p.o. every afternoon for mood and anxiety. 4. Continue Lamictal 200 mg p.o. twice daily for mood stability. 5. Continue Pepcid 20 mg p.o. twice daily for reflux 6. Continue Protonix 40 mg p.o. daily for reflux disease. 7.ContinueAtivan 0.5 mg p.o. BID anxiety as needed and nightly as needed for anxiety and  sleep. 8. Return to clinic in4 weeks.  Antonieta Pert, MD 10/17/2019, 1:56 PM

## 2019-11-21 ENCOUNTER — Telehealth (HOSPITAL_COMMUNITY): Payer: BC Managed Care – PPO | Admitting: Psychiatry

## 2019-11-21 ENCOUNTER — Other Ambulatory Visit: Payer: Self-pay

## 2019-11-21 ENCOUNTER — Telehealth (HOSPITAL_COMMUNITY): Payer: Self-pay | Admitting: *Deleted

## 2019-11-21 NOTE — Telephone Encounter (Signed)
Opened in error

## 2019-12-05 ENCOUNTER — Telehealth (INDEPENDENT_AMBULATORY_CARE_PROVIDER_SITE_OTHER): Payer: BC Managed Care – PPO | Admitting: Psychiatry

## 2019-12-05 ENCOUNTER — Other Ambulatory Visit: Payer: Self-pay

## 2019-12-05 DIAGNOSIS — F319 Bipolar disorder, unspecified: Secondary | ICD-10-CM

## 2019-12-05 DIAGNOSIS — F411 Generalized anxiety disorder: Secondary | ICD-10-CM | POA: Diagnosis not present

## 2019-12-05 NOTE — Progress Notes (Signed)
BH MD/PA/NP OP Progress Note  12/05/2019 3:20 PM Travis Whitney  MRN:  [id number]  Chief Complaint:  Bipolar disorder, generalized anxiety disorder, insomnia HPI: male with the above-stated past psychiatric history is seen in follow-up.  This is a telephone visit today, and a 2 factor identification process was done.  Greater than 50% of the 20-minute visit today was spent on direct patient care and evaluation.  Psychiatrically it sounds like he is doing well, but he had some problems recently.  He has some GI issues, and ate and then ended up having a severe bout of vomiting.  He went to the emergency room for evaluation. He had a CT of the abdomen done and revealed mesenteric adenopathy and stranding and a large mesenteric lymph node measuring 2.6 x 3.3 cm.  There were also some disc herniations mentioned at L4-L5 and S1.  We discussed making sure that he had follow-up with primary care to evaluate this.  He is quite concerned about it.  He stated that the Seroquel continues to help him sleep, and despite this increased anxiety is done okay.  He stated that he is taking the as needed lorazepam anywhere between 9-3 times a day depending on stress.  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  No suicidal ideation.  No evidence of mania or severe depression.  Visit Diagnosis: No diagnosis found.  Past Psychiatric History: See initial evaluation.  Past Medical History: No past medical history on file. No past surgical history on file.  Family Psychiatric History: See initial evaluation.  Family History: No family history on file.  Social History:  Social History   Socioeconomic History   Marital status: Married    Spouse name: Not on file   Number of children: Not on file   Years of education: Not on file   Highest education level: Not on file  Occupational History   Not on file  Tobacco Use   Smoking status: Never Smoker  Substance  and Sexual Activity   Alcohol use: Yes   Drug use: Never   Sexual activity: Yes    Partners: Female  Other Topics Concern   Not on file  Social History Narrative   Not on file   Social Determinants of Health   Financial Resource Strain:    Difficulty of Paying Living Expenses:   Food Insecurity:    Worried About Programme researcher, broadcasting/film/video in the Last Year:    Barista in the Last Year:   Transportation Needs:    Freight forwarder (Medical):    Lack of Transportation (Non-Medical):   Physical Activity:    Days of Exercise per Week:    Minutes of Exercise per Session:   Stress:    Feeling of Stress :   Social Connections:    Frequency of Communication with Friends and Family:    Frequency of Social Gatherings with Friends and Family:    Attends Religious Services:    Active Member of Clubs or Organizations:    Attends Engineer, structural:    Marital Status:     Allergies: Not on File  Metabolic Disorder Labs: No results found for: HGBA1C, MPG No results found for: PROLACTIN No results found for: CHOL, TRIG, HDL, CHOLHDL, VLDL, LDLCALC No results found for: TSH  Therapeutic Level Labs: No results found for: LITHIUM No results found for: VALPROATE No components found for:  CBMZ  Current Medications: Current Outpatient  Medications  Medication Sig Dispense Refill   buPROPion (WELLBUTRIN) 75 MG tablet TAKE 2 TABLET BY MOUTH EVERY DAY AND 1 TABLET BY MOUTH EVERY NIGHT 90 tablet 4   famotidine (PEPCID) 20 MG tablet TK 1 T PO BID PRN 60 tablet 3   lamoTRIgine (LAMICTAL) 200 MG tablet TK 1 T PO BID 60 tablet 4   LORazepam (ATIVAN) 0.5 MG tablet One tab po BID prn anxiety and 1 tab po q hs prn insomnia 60 tablet 0   OXcarbazepine (TRILEPTAL) 150 MG tablet 2 tabs po q day and 3 tabs po q hs 150 tablet 3   pantoprazole (PROTONIX) 40 MG tablet TK 1 T PO QD 30 tablet 5   QUEtiapine (SEROQUEL) 100 MG tablet One and one half to two tabs  po q hs 60 tablet 2   No current facility-administered medications for this visit.     Musculoskeletal: Strength & Muscle Tone: NA Gait & Station: NA Patient leans: N/A  Psychiatric Specialty Exam: Review of Systems  There were no vitals taken for this visit.There is no height or weight on file to calculate BMI.  General Appearance: NA  Eye Contact:  NA  Speech:  Normal Rate  Volume:  Normal  Mood:  Anxious  Affect:  Congruent  Thought Process:  Coherent and Descriptions of Associations: Intact  Orientation:  Full (Time, Place, and Person)  Thought Content: Logical   Suicidal Thoughts:  No  Homicidal Thoughts:  No  Memory:  Immediate;   Good Recent;   Good Remote;   Good  Judgement:  Intact  Insight:  Fair  Psychomotor Activity:  NA  Concentration:  Concentration: Fair and Attention Span: Fair  Recall:  [employer] of Knowledge: Fair  Language: Good  Akathisia:  Negative  Handed:  Right  AIMS (if indicated): not done  Assets:  Communication Skills Desire for Improvement Financial Resources/Insurance Resilience Social Support Talents/Skills Transportation  ADL's:  Intact  Cognition: WNL  Sleep:  Good   Screenings:   Assessment and Plan: Patient is seen and examined.  Patient is a 44 year old male with the above-stated past psychiatric history is seen in follow-up.  He is doing well.  This was a telephone visit today of which greater than 50% of the 20-minute visit was spent on direct patient care and evaluation.  Given the circumstances I think he is doing actually quite well.  I will attempt to find out and make a referral regarding the lymph node.  It certainly is worrisome.  No change in his medications currently, and I will see him back in about a month just to make sure that there is no further deterioration in that he is got medical follow-up for this finding.  Plan: 1. ContinueSeroquel to 100 mg 1and one half to2 tabs p.o. nightly for mood stability and  sleep. 2. Continue Trileptal 450 mg p.o. daily and 300 mg p.o. nightly for mood stability and anxiety. 3. Continue short acting Wellbutrin 75 mg 2 tabs p.o. daily and 1 tab p.o. every afternoon for mood and anxiety. 4. Continue Lamictal 200 mg p.o. twice daily for mood stability. 5. Continue Pepcid 20 mg p.o. twice daily for reflux 6. Continue Protonix 40 mg p.o. daily for reflux disease. 7.ContinueAtivan 0.5 mg p.o. BID anxiety as needed and nightly as needed for anxiety and sleep. 8.  Make sure about follow-up given findings on CT scan of the abdomen. 9. Return to clinic in4 weeks.   Antonieta Pert, MD  12/05/2019, 3:20 PM

## 2019-12-07 ENCOUNTER — Telehealth (HOSPITAL_COMMUNITY): Payer: Self-pay | Admitting: *Deleted

## 2019-12-07 NOTE — Telephone Encounter (Signed)
Writer spoke to pt to inform that lab and MRI orders have been faxed to [facility]. Pt is familiar with this location. Writer shared that scheduling should contact him for MRI appointment. Pt verbalizes understanding and is anxious to get this "overwith".

## 2019-12-14 ENCOUNTER — Other Ambulatory Visit (HOSPITAL_COMMUNITY): Payer: Self-pay | Admitting: *Deleted

## 2019-12-15 ENCOUNTER — Other Ambulatory Visit (HOSPITAL_COMMUNITY): Payer: Self-pay | Admitting: *Deleted

## 2019-12-15 DIAGNOSIS — R1900 Intra-abdominal and pelvic swelling, mass and lump, unspecified site: Secondary | ICD-10-CM

## 2019-12-15 NOTE — Addendum Note (Signed)
Addended by: Delanna Ahmadi on: 12/15/2019 12:38 PM   Modules accepted: Orders

## 2019-12-21 ENCOUNTER — Encounter: Payer: Self-pay | Admitting: Psychiatry

## 2019-12-23 LAB — H PYLORI, IGM, IGG, IGA AB
H pylori, IgM Abs: <9 U (ref 0.0–8.9)
H. pylori, IgA Abs: <9 U (ref 0.0–8.9)
H. pylori, IgG AbS: 0.48 {index_val} (ref 0.00–0.79)

## 2019-12-26 ENCOUNTER — Telehealth (INDEPENDENT_AMBULATORY_CARE_PROVIDER_SITE_OTHER): Payer: BC Managed Care – PPO | Admitting: Psychiatry

## 2019-12-26 ENCOUNTER — Other Ambulatory Visit: Payer: Self-pay

## 2019-12-26 DIAGNOSIS — F319 Bipolar disorder, unspecified: Secondary | ICD-10-CM

## 2019-12-26 DIAGNOSIS — F411 Generalized anxiety disorder: Secondary | ICD-10-CM | POA: Diagnosis not present

## 2019-12-26 NOTE — Progress Notes (Signed)
BH MD/PA/NP OP Progress Note  12/26/2019 9:29 AM Viral Schramm  MRN:  [id number]  Chief Complaint: Bipolar disorder, generalized anxiety disorder, insomnia, diarrhea, weight loss, mesenteric inflammation with lymphadenopathy  HPI: Patient is seen and examined.  Patient is a 44 year old male with the above-stated past medical and psychiatric history who is seen in follow-up.  This is a telephone visit today and a 2 factor identification process was done.  Greater than 50% of the 20-minute visit was spent on direct patient care and evaluation.  The evaluation was done from my home to the patient's home.  Since her last visit there have been several issues.  He had the previous CT scan of the abdomen done after an episode of nausea and vomiting.  It revealed a large mesenteric lymph node measuring 2.6 x 3.3 cm.  An MRI of his abdomen and pelvis was obtained, and those results are not in the chart, but revealed again mesenteric lymphadenopathy as well as some stranding showing inflammatory process.  Previously he has had issues with reflux disease and had H. pylori studies done.  These were negative.  Additionally he had stool studies done, and they were all essentially negative.  Again, they are not in the chart currently and this is being done by memory.  He did have a mild anemia that was reported earlier, but patient denied any symptoms of night sweats and other constitutional symptoms outside of some weight loss.  It should be noted that the weight loss started after his initial episode.  I became involved in this after the original CT scan.  On the telephone visit today his mood is stable.  His sleep is stable.  He is coping better with the possibility of some issue going on his abdomen.  He has been referred to hematology/oncology as well as gastroenterology at [facility].  We do not have dates on those appointments at this point.  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  He stated the diarrhea has continued, and we increase the dosage of his Imodium, and the stool has not become normal, but is less frequent.  He stated his sleep was good, mood was stable, no suicidal or homicidal ideations.  He is concerned because of his daughter moving to the [place] region to be closer to horse business issues. Visit Diagnosis: No diagnosis found.  Past Psychiatric History: See initial evaluation.  Past Medical History: No past medical history on file. No past surgical history on file.  Family Psychiatric History: See initial evaluation.  Family History: No family history on file.  Social History:  Social History   Socioeconomic History  . Marital status: Married    Spouse name: Not on file  . Number of children: Not on file  . Years of education: Not on file  . Highest education level: Not on file  Occupational History  . Not on file  Tobacco Use  . Smoking status: Never Smoker  Substance and Sexual Activity  . Alcohol use: Yes  . Drug use: Never  . Sexual activity: Yes    Partners: Female  Other Topics Concern  . Not on file  Social History Narrative  . Not on file   Social Determinants of Health   Financial Resource Strain:   . Difficulty of Paying Living Expenses:   Food Insecurity:   . Worried About Programme researcher, broadcasting/film/video in the Last Year:   .  Ran Out of Food in the Last Year:   Transportation Needs:   . Freight forwarder (Medical):   Marland Kitchen Lack of Transportation (Non-Medical):   Physical Activity:   . Days of Exercise per Week:   . Minutes of Exercise per Session:   Stress:   . Feeling of Stress :   Social Connections:   . Frequency of Communication with Friends and Family:   . Frequency of Social Gatherings with Friends and Family:   . Attends Religious Services:   . Active Member of Clubs or Organizations:   . Attends Banker Meetings:   Marland Kitchen Marital Status:     Allergies:  Not on File  Metabolic Disorder Labs: No results found for: HGBA1C, MPG No results found for: PROLACTIN No results found for: CHOL, TRIG, HDL, CHOLHDL, VLDL, LDLCALC No results found for: TSH  Therapeutic Level Labs: No results found for: LITHIUM No results found for: VALPROATE No components found for:  CBMZ  Current Medications: Current Outpatient Medications  Medication Sig Dispense Refill  . buPROPion (WELLBUTRIN) 75 MG tablet TAKE 2 TABLET BY MOUTH EVERY DAY AND 1 TABLET BY MOUTH EVERY NIGHT 90 tablet 4  . famotidine (PEPCID) 20 MG tablet TK 1 T PO BID PRN 60 tablet 3  . lamoTRIgine (LAMICTAL) 200 MG tablet TK 1 T PO BID 60 tablet 4  . LORazepam (ATIVAN) 0.5 MG tablet One tab po BID prn anxiety and 1 tab po q hs prn insomnia 60 tablet 0  . OXcarbazepine (TRILEPTAL) 150 MG tablet 2 tabs po q day and 3 tabs po q hs 150 tablet 3  . pantoprazole (PROTONIX) 40 MG tablet TK 1 T PO QD 30 tablet 5  . QUEtiapine (SEROQUEL) 100 MG tablet One and one half to two tabs po q hs 60 tablet 2   No current facility-administered medications for this visit.     Musculoskeletal: Strength & Muscle Tone: NA Gait & Station: NA Patient leans: N/A  Psychiatric Specialty Exam: Review of Systems  There were no vitals taken for this visit.There is no height or weight on file to calculate BMI.  General Appearance: NA  Eye Contact:  NA  Speech:  Normal Rate  Volume:  Normal  Mood:  Euthymic  Affect:  Congruent  Thought Process:  Coherent and Descriptions of Associations: Intact  Orientation:  Full (Time, Place, and Person)  Thought Content: Logical   Suicidal Thoughts:  No  Homicidal Thoughts:  No  Memory:  Immediate;   Good Recent;   Good Remote;   Good  Judgement:  Good  Insight:  Good  Psychomotor Activity:  NA  Concentration:  Concentration: Good and Attention Span: Good  Recall:  Good  Fund of Knowledge: Good  Language: Good  Akathisia:  Negative  Handed:  Right  AIMS (if  indicated): not done  Assets:  Communication Skills Desire for Improvement Financial Resources/Insurance Housing Resilience Social Support Talents/Skills Transportation Vocational/Educational  ADL's:  Intact  Cognition: WNL  Sleep:  Good   Screenings:   Assessment and Plan: Patient is seen and examined.  Patient is a 43 year old male with the above-stated past medical and psychiatric history who is seen in follow-up and telephone visit today.  His psychiatric situation is stable.  He is anxious over trying to find out what is going on with this finding by radiology.  Consultations have been requested with [place] medical and hematology/oncology as well as GI.  No change in his current psychiatric medications, no  change in his reflux medicines at this point.  Hopefully we will be able to get him in consultation soon, and find out whether or not the findings by radiology are pertinent or not.  Clearly lymphoma has to be at the top of the list of concerns at least at this point.  Plan: 1. ContinueSeroquel to 100 mg 1and one half to2 tabs p.o. nightly for mood stability and sleep. 2. Continue Trileptal 450 mg p.o. daily and 300 mg p.o. nightly for mood stability and anxiety. 3. Continue short acting Wellbutrin 75 mg 2 tabs p.o. daily and 1 tab p.o. every afternoon for mood and anxiety. 4. Continue Lamictal 200 mg p.o. twice daily for mood stability. 5. Continue Pepcid 20 mg p.o. twice daily for reflux 6. Continue Protonix 40 mg p.o. daily for reflux disease. 7.ContinueAtivan 0.5 mg p.o. BID anxiety as needed and nightly as needed for anxiety and sleep. 8.  Consultation with hematology/oncology and gastroenterology at [facility] medical clinic for radiological findings as well as symptoms. 9.  Follow-up with me in approximately 4 weeks.Antonieta Pert, MD 12/26/2019, 9:29 AM

## 2019-12-30 NOTE — Progress Notes (Signed)
This is an addendum to the visit of 12/05/2019.  This was a telephone visit in which a 2 factor identification process was done.  Greater than 50% of the 20-minute visit was spent in direct patient care and evaluation.  I was located in my home and he was in his truck.

## 2020-01-02 ENCOUNTER — Telehealth (HOSPITAL_COMMUNITY): Payer: BC Managed Care – PPO | Admitting: Psychiatry

## 2020-01-06 ENCOUNTER — Other Ambulatory Visit (HOSPITAL_COMMUNITY): Payer: Self-pay | Admitting: Psychiatry

## 2020-01-06 DIAGNOSIS — K6389 Other specified diseases of intestine: Secondary | ICD-10-CM

## 2020-01-07 ENCOUNTER — Other Ambulatory Visit (HOSPITAL_COMMUNITY): Payer: Self-pay | Admitting: Psychiatry

## 2020-01-11 ENCOUNTER — Telehealth: Payer: Self-pay | Admitting: Hematology

## 2020-01-11 NOTE — Telephone Encounter (Signed)
Received a new pt referral from Dr. Thera Flake at [facility] for mesenteric mass. Received a call from [facility] at Dr. Lamar Sprinkles office to schedule a new pt appt. Per Marcelino Duster, pt was unable to get an appt in [place] until September and Dr. Thera Flake requested the pt to be seen in [facility]. I scheduled Travis Whitney to see Dr. Candise Che on 8/3 at 240pm. Marcelino Duster will notify the pt of the appt date and time.

## 2020-01-17 ENCOUNTER — Inpatient Hospital Stay: Payer: BC Managed Care – PPO | Attending: Hematology | Admitting: Hematology

## 2020-01-17 ENCOUNTER — Telehealth: Payer: Self-pay | Admitting: *Deleted

## 2020-01-17 ENCOUNTER — Other Ambulatory Visit: Payer: Self-pay

## 2020-01-17 ENCOUNTER — Inpatient Hospital Stay: Payer: BC Managed Care – PPO

## 2020-01-17 VITALS — BP 142/71 | HR 71 | Temp 97.5°F | Resp 18 | Ht 78.0 in | Wt 268.2 lb

## 2020-01-17 DIAGNOSIS — I880 Nonspecific mesenteric lymphadenitis: Secondary | ICD-10-CM | POA: Diagnosis not present

## 2020-01-17 DIAGNOSIS — R590 Localized enlarged lymph nodes: Secondary | ICD-10-CM | POA: Diagnosis not present

## 2020-01-17 DIAGNOSIS — R197 Diarrhea, unspecified: Secondary | ICD-10-CM | POA: Diagnosis not present

## 2020-01-17 DIAGNOSIS — R634 Abnormal weight loss: Secondary | ICD-10-CM

## 2020-01-17 DIAGNOSIS — K760 Fatty (change of) liver, not elsewhere classified: Secondary | ICD-10-CM

## 2020-01-17 DIAGNOSIS — R1031 Right lower quadrant pain: Secondary | ICD-10-CM | POA: Diagnosis not present

## 2020-01-17 DIAGNOSIS — R162 Hepatomegaly with splenomegaly, not elsewhere classified: Secondary | ICD-10-CM | POA: Diagnosis not present

## 2020-01-17 DIAGNOSIS — R5383 Other fatigue: Secondary | ICD-10-CM | POA: Diagnosis not present

## 2020-01-17 DIAGNOSIS — R1909 Other intra-abdominal and pelvic swelling, mass and lump: Secondary | ICD-10-CM | POA: Diagnosis not present

## 2020-01-17 DIAGNOSIS — R1110 Vomiting, unspecified: Secondary | ICD-10-CM | POA: Diagnosis not present

## 2020-01-17 DIAGNOSIS — R0789 Other chest pain: Secondary | ICD-10-CM | POA: Diagnosis not present

## 2020-01-17 DIAGNOSIS — J45909 Unspecified asthma, uncomplicated: Secondary | ICD-10-CM | POA: Insufficient documentation

## 2020-01-17 DIAGNOSIS — F319 Bipolar disorder, unspecified: Secondary | ICD-10-CM

## 2020-01-17 DIAGNOSIS — Z6830 Body mass index (BMI) 30.0-30.9, adult: Secondary | ICD-10-CM | POA: Diagnosis not present

## 2020-01-17 DIAGNOSIS — N4341 Spermatocele of epididymis, single: Secondary | ICD-10-CM | POA: Diagnosis not present

## 2020-01-17 DIAGNOSIS — Z79899 Other long term (current) drug therapy: Secondary | ICD-10-CM | POA: Diagnosis not present

## 2020-01-17 LAB — CMP (CANCER CENTER ONLY)
ALT: 69 U/L — ABNORMAL HIGH (ref 0–44)
AST: 31 U/L (ref 15–41)
Albumin: 3 g/dL — ABNORMAL LOW (ref 3.5–5.0)
Alkaline Phosphatase: 167 U/L — ABNORMAL HIGH (ref 38–126)
Anion gap: 9 (ref 5–15)
BUN: 10 mg/dL (ref 6–20)
CO2: 28 mmol/L (ref 22–32)
Calcium: 7.5 mg/dL — ABNORMAL LOW (ref 8.9–10.3)
Chloride: 105 mmol/L (ref 98–111)
Creatinine: 0.87 mg/dL (ref 0.61–1.24)
GFR, Est AFR Am: >60 mL/min (ref >60)
GFR, Estimated: >60 mL/min (ref >60)
Glucose, Bld: 98 mg/dL (ref 70–99)
Potassium: 2.9 mmol/L — CL (ref 3.5–5.1)
Sodium: 142 mmol/L (ref 135–145)
Total Bilirubin: 0.2 mg/dL — ABNORMAL LOW (ref 0.3–1.2)
Total Protein: 5.1 g/dL — ABNORMAL LOW (ref 6.5–8.1)

## 2020-01-17 LAB — CBC WITH DIFFERENTIAL/PLATELET
Abs Immature Granulocytes: 0.02 10*3/uL (ref 0.00–0.07)
Basophils Absolute: 0.1 10*3/uL (ref 0.0–0.1)
Basophils Relative: 1 %
Eosinophils Absolute: 0.2 10*3/uL (ref 0.0–0.5)
Eosinophils Relative: 3 %
HCT: 38.3 % — ABNORMAL LOW (ref 39.0–52.0)
Hemoglobin: 11.6 g/dL — ABNORMAL LOW (ref 13.0–17.0)
Immature Granulocytes: 0 %
Lymphocytes Relative: 23 %
Lymphs Abs: 1.5 10*3/uL (ref 0.7–4.0)
MCH: 21.9 pg — ABNORMAL LOW (ref 26.0–34.0)
MCHC: 30.3 g/dL (ref 30.0–36.0)
MCV: 72.4 fL — ABNORMAL LOW (ref 80.0–100.0)
Monocytes Absolute: 0.6 10*3/uL (ref 0.1–1.0)
Monocytes Relative: 9 %
Neutro Abs: 4.3 10*3/uL (ref 1.7–7.7)
Neutrophils Relative %: 64 %
Platelets: 340 10*3/uL (ref 150–400)
RBC: 5.29 MIL/uL (ref 4.22–5.81)
RDW: 17.2 % — ABNORMAL HIGH (ref 11.5–15.5)
WBC: 6.7 10*3/uL (ref 4.0–10.5)
nRBC: 0 % (ref 0.0–0.2)

## 2020-01-17 LAB — C-REACTIVE PROTEIN: CRP: 0.6 mg/dL (ref <1.0)

## 2020-01-17 LAB — LACTATE DEHYDROGENASE: LDH: 228 U/L — ABNORMAL HIGH (ref 98–192)

## 2020-01-17 LAB — SEDIMENTATION RATE: Sed Rate: 1 mm/hr (ref 0–16)

## 2020-01-17 MED ORDER — POTASSIUM CHLORIDE CRYS ER 20 MEQ PO TBCR: EXTENDED_RELEASE_TABLET | ORAL | 1 refills | Status: AC

## 2020-01-17 NOTE — Telephone Encounter (Signed)
Contacted patient per Dr. Candise Che instructions: Potassium 2.9 - potassium supplement ordered. Sent to [facility] in [place], [place].  Patient verbalized understanding of results and med instructions

## 2020-01-17 NOTE — Progress Notes (Signed)
HEMATOLOGY/ONCOLOGY CONSULTATION NOTE  Date of Service: 01/17/2020  Patient Care Team: Patient, No Pcp Per as PCP - General (General Practice)  CHIEF COMPLAINTS/PURPOSE OF CONSULTATION:  Mesenteric mass  HISTORY OF PRESENTING ILLNESS:  Travis Whitney is a wonderful 44 y.o. male who has been referred to [place] by Dr. Jola Babinski for evaluation and management of mesenteric mass. The pt reports that he is doing well overall.   The pt reports that he is seeing Dr. Jola Babinski for bipolar disorder and denies many other health issues. Pt has no medication allergies, but recently discovered that he is allergic to wasp stings. Pt was stung 6 times and began experiencing chest tightness and shortness of breath. He was given a short-course of steroids. He has no work-related chemical exposure.   He is currently experiencing liquid stools multiple times per day with abdominal cramping. The severity of the diarrhea varies, but is improved with certain foods like rice or potatoes. He has no improvement in abdominal cramping after having a bowel movement. Pt was vomiting a lot when these symptoms first began, but this has mostly resolved. Pt lost nearly thirty pounds over the last 6 weeks.  He has been on Protonix and Pepcid for two years. He has an upcoming appointment with a GI in 01/26/20.  He experienced similar symptoms 5 years ago. He did not have any abdominal pain but had diarrhea that lasted for about 9 months. Over the course of this time period pt lost about 70 lbs. He did not seek any medical assistance, as he thought that he had irritable bowels. Pt has not been able to eat any lactose products since his first episode. Pt admits that his stools never returned to "normal", but were less watery, until now.   He has also been experiencing dark, foul smelling urine after drinking 4-5 liters of water per day. 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 Pt was told that the  spermatocele will be removed if/when it becomes bothersome.  Pt has two herniated discs and Spinal stenosis. He was given steroid injections for back pain earlier this year, prior to the start of his symptoms.      Pt has had CT Abd/Pel completed on 12/03/2019 with results revealing "1.Mesenteric adenopathy and stranding and a large irregular mesenteric lymph node or mass measuring 2.6 x 3.3 cm. Follow-up is recommended to distinguish mesenteric adenitis from lymphoma. 2. L4-L5 and L5-S1 disc herniations."  Pt has had MRI Abd completed on 12/19/2019 with results revealing "1. Mild amount of mesenteric fat stranding seen along the root of the mesentery on the recent CT scan. There are multiple enlarged mesenteric lymph nodes withinthe mid and left lateral abdominal mesentery which are nonspecific and may be reactive in nature due to mesenteric adenitis and/or mesenteric panniculitis although lymphoproliferative disease/lymphoma could also have this appearance. PET/CT scan is recommended for better evaluation. 2. Hepatosplenomegaly with diffuse fatty infiltration of the liver."  On review of systems, pt reports fatigue, abdominal pain, diarrhea, abnormally colored urine, foul smelling urine, unexpected weight loss and denies fevers, chills, rash, night sweats, bloody stools, mucous in stools, headaches, changes in vision, SOB, new lumps/bumps and any other symptoms.   On PMHx the pt reports childhood Asthma, Bipolar disorder, Herniated disc, Spinal stenosis. On Social Hx the pt reports that he is a non-smoker that drinks alcohol occasionally.  On Family Hx the pt reports that his father passed from Prostate Cancer  in his 37's (Agent Orange exposure), he also had Long QT syndrome. His brother has Sarcoidosis.    MEDICAL HISTORY:  childhood Asthma, Bipolar disorder, Herniated disc, Spinal stenosis.  SURGICAL HISTORY: No past surgical history on file.  SOCIAL HISTORY: Social History   Socioeconomic  History  . Marital status: Married    Spouse name: Not on file  . Number of children: Not on file  . Years of education: Not on file  . Highest education level: Not on file  Occupational History  . Not on file  Tobacco Use  . Smoking status: Never Smoker  Substance and Sexual Activity  . Alcohol use: Yes  . Drug use: Never  . Sexual activity: Yes    Partners: Female  Other Topics Concern  . Not on file  Social History Narrative  . Not on file   Social Determinants of Health   Financial Resource Strain:   . Difficulty of Paying Living Expenses:   Food Insecurity:   . Worried About Programme researcher, broadcasting/film/video in the Last Year:   . Barista in the Last Year:   Transportation Needs:   . Freight forwarder (Medical):   Marland Kitchen Lack of Transportation (Non-Medical):   Physical Activity:   . Days of Exercise per Week:   . Minutes of Exercise per Session:   Stress:   . Feeling of Stress :   Social Connections:   . Frequency of Communication with Friends and Family:   . Frequency of Social Gatherings with Friends and Family:   . Attends Religious Services:   . Active Member of Clubs or Organizations:   . Attends Banker Meetings:   Marland Kitchen Marital Status:   Intimate Partner Violence:   . Fear of Current or Ex-Partner:   . Emotionally Abused:   Marland Kitchen Physically Abused:   . Sexually Abused:     FAMILY HISTORY: No family history on file.  ALLERGIES:  has no allergies on file.  MEDICATIONS:  Current Outpatient Medications  Medication Sig Dispense Refill  . buPROPion (WELLBUTRIN) 75 MG tablet TAKE 2 TABLET BY MOUTH EVERY DAY AND 1 TABLET BY MOUTH EVERY NIGHT 90 tablet 4  . famotidine (PEPCID) 20 MG tablet TK 1 T PO BID PRN 60 tablet 3  . lamoTRIgine (LAMICTAL) 200 MG tablet TK 1 T PO BID 60 tablet 4  . LORazepam (ATIVAN) 0.5 MG tablet TAKE 1 TABLET BY MOUTH TWICE DAILY AS NEEDED FOR ANXIETY AND 1 TABLET BY MOUTH EVERY NIGHT AT BEDTIME AS NEEDED FOR INSOMNIA 60 tablet 2    . OXcarbazepine (TRILEPTAL) 150 MG tablet 2 tabs po q day and 3 tabs po q hs 150 tablet 3  . pantoprazole (PROTONIX) 40 MG tablet TK 1 T PO QD 30 tablet 5  . QUEtiapine (SEROQUEL) 100 MG tablet One and one half to two tabs po q hs 60 tablet 2   No current facility-administered medications for this visit.    REVIEW OF SYSTEMS:    10 Point review of Systems was done is negative except as noted above.  PHYSICAL EXAMINATION: ECOG PERFORMANCE STATUS: 1 - Symptomatic but completely ambulatory  . Vitals:   01/17/20 1350  BP: (!) 142/71  Pulse: 71  Resp: 18  Temp: (!) 97.5 F (36.4 C)  SpO2: 97%   Filed Weights   01/17/20 1350  Weight: 268 lb 3.2 oz (121.7 kg)   .Body mass index is 30.99 kg/m.  GENERAL:alert, in no acute distress and  comfortable SKIN: no acute rashes, no significant lesions EYES: conjunctiva are pink and non-injected, sclera anicteric OROPHARYNX: MMM, no exudates, no oropharyngeal erythema or ulceration NECK: supple, no JVD LYMPH:  no palpable lymphadenopathy in the cervical, axillary or inguinal regions LUNGS: clear to auscultation b/l with normal respiratory effort HEART: regular rate & rhythm ABDOMEN:  normoactive bowel sounds, not distended. Guarding with no rebounding or rigidity.  Extremity: no pedal edema PSYCH: alert & oriented x 3 with fluent speech NEURO: no focal motor/sensory deficits  LABORATORY DATA:  I have reviewed the data as listed  . CBC Latest Ref Rng & Units 01/17/2020  WBC 4.0 - 10.5 K/uL 6.7  Hemoglobin 13.0 - 17.0 g/dL 11.6(L)  Hematocrit 39 - 52 % 38.3(L)  Platelets 150 - 400 K/uL 340    . CMP Latest Ref Rng & Units 01/17/2020  Glucose 70 - 99 mg/dL 98  BUN 6 - 20 mg/dL 10  Creatinine 1.610.61 - 0.961.24 mg/dL 0.450.87  Sodium 409135 - 811145 mmol/L 142  Potassium 3.5 - 5.1 mmol/L 2.9(LL)  Chloride 98 - 111 mmol/L 105  CO2 22 - 32 mmol/L 28  Calcium 8.9 - 10.3 mg/dL 7.5(L)  Total Protein 6.5 - 8.1 g/dL 5.1(L)  Total Bilirubin 0.3 - 1.2  mg/dL 9.1(Y0.2(L)  Alkaline Phos 38 - 126 U/L 167(H)  AST 15 - 41 U/L 31  ALT 0 - 44 U/L 69(H)     RADIOGRAPHIC STUDIES: I have personally reviewed the radiological images as listed and agreed with the findings in the report. No results found.  ASSESSMENT & PLAN:   44 yo with   1) Mesenteric Lymphadenopathy  ?mesenteric lymphadenitis - though stool cx were neg vs Lymphoproliferative disorder vs other etiology PLAN: -Discussed 12/03/2019 CT Abd/Pel which revealed "1.Mesenteric adenopathy and stranding and a large irregular mesenteric lymph node or mass measuring 2.6 x 3.3 cm. Follow-up is recommended to distinguish mesenteric adenitis from lymphoma. 2. L4-L5 and L5-S1 disc herniations." -Discussed 12/19/2019 MRI Abd which revealed "1. Mild amount of mesenteric fat stranding seen along the root of the mesentery on the recent CT scan. There are multiple enlarged mesenteric lymph nodes withinthe mid and left lateral abdominal mesentery which are nonspecific and may be reactive in nature due to mesenteric adenitis and/or mesenteric panniculitis although lymphoproliferative disease/lymphoma could also have this appearance. PET/CT scan is recommended for better evaluation. 2. Hepatosplenomegaly with diffuse fatty infiltration of the liver." -There were no infectious processes discovered on labs that would explain such extensive lymphadeopathy.  -Advised pt that due to the size of his lymph nodes we would want to complete a w/o as opposed to watching with scans.  -Advised pt that w/o would include labs, PET/CT scan and potentially a needle biopsy.  -Recommend pt f/u with GI as scheduled -Will get PET/CT in 5-7 days  -Will get labs today -Will see back in 10 days via phone.    FOLLOW UP: Labs today including stool testing PET/CT in 5-7 days Phone visit with Dr Candise CheKale in 10 days  . Orders Placed This Encounter  Procedures  . Gastrointestinal Panel by PCR , Stool    Standing Status:   Future     Standing Expiration Date:   01/16/2021  . NM PET Image Initial (PI) Skull Base To Thigh    Standing Status:   Future    Standing Expiration Date:   01/16/2021    Order Specific Question:   If indicated for the ordered procedure, I authorize the administration of a  radiopharmaceutical per Radiology protocol    Answer:   Yes    Order Specific Question:   Preferred imaging location?    Answer:   [facility]    Order Specific Question:   Radiology Contrast Protocol - do NOT remove file path    Answer:   [email or web address]  . Lactoferrin, Fecal,Qualitative    Standing Status:   Future    Standing Expiration Date:   01/16/2021  . Gastrointestinal Pathogen Panel PCR    Standing Status:   Future    Standing Expiration Date:   01/16/2021  . CBC with Differential/Platelet    Standing Status:   Future    Number of Occurrences:   1    Standing Expiration Date:   01/16/2021  . CMP (Cancer Center only)    Standing Status:   Future    Number of Occurrences:   1    Standing Expiration Date:   01/16/2021  . Lactate dehydrogenase    Standing Status:   Future    Number of Occurrences:   1    Standing Expiration Date:   01/16/2021  . Sedimentation rate    Standing Status:   Future    Number of Occurrences:   1    Standing Expiration Date:   01/16/2021  . AFP tumor marker    Standing Status:   Future    Number of Occurrences:   1    Standing Expiration Date:   01/16/2021  . Beta HCG, Quant (tumor marker)    Standing Status:   Future    Number of Occurrences:   1    Standing Expiration Date:   01/16/2021  . Chromogranin A    Standing Status:   Future    Number of Occurrences:   1    Standing Expiration Date:   01/16/2021  . Neuron-specific enolase(NSE), blood    Standing Status:   Future    Number of Occurrences:   1    Standing Expiration Date:   01/16/2021  . CEA (IN HOUSE-[facility])    Standing Status:   Future    Number of Occurrences:   1    Standing Expiration Date:   01/16/2021  .  Cancer antigen 19-9    Standing Status:   Future    Number of Occurrences:   1    Standing Expiration Date:   01/16/2021  . C-reactive protein    Standing Status:   Future    Number of Occurrences:   1    Standing Expiration Date:   01/16/2021  . Ferritin    Standing Status:   Future    Standing Expiration Date:   01/16/2021  . Vitamin D 25 hydroxy    Standing Status:   Future    Standing Expiration Date:   01/16/2021    All of the patients questions were answered with apparent satisfaction. The patient knows to call the clinic with any problems, questions or concerns.  I spent 40 mins counseling the patient face to face. The total time spent in the appointment was 60 minutes and more than 50% was on counseling and direct patient cares.    Wyvonnia Lora MD MS AAHIVMS [facility] Hematology/Oncology Physician [facility]  (Office):       [phone] (Work cell):  [phone] (Fax):           [phone]  01/17/2020 4:28 PM  I, Carollee Herter, am acting as a scribe for Dr. Wyvonnia Lora.   Marland Kitchen  I have reviewed the above documentation for accuracy and completeness, and I agree with the above. Johney Maine MD

## 2020-01-18 LAB — BETA HCG QUANT (REF LAB): hCG Quant: <1 m[IU]/mL (ref 0–3)

## 2020-01-18 LAB — CEA (IN HOUSE-CHCC): CEA (CHCC-In House): 1.11 ng/mL (ref 0.00–5.00)

## 2020-01-18 LAB — NEURON-SPECIFIC ENOLASE(NSE), BLOOD: Neuron-specific Enolase, Serum: 9.6 ng/mL (ref 0.0–17.6)

## 2020-01-18 LAB — CANCER ANTIGEN 19-9: CA 19-9: 5 U/mL (ref 0–35)

## 2020-01-18 LAB — AFP TUMOR MARKER: AFP, Serum, Tumor Marker: 1.1 ng/mL (ref 0.0–8.3)

## 2020-01-19 LAB — CHROMOGRANIN A: Chromogranin A (ng/mL): 163.7 ng/mL — ABNORMAL HIGH (ref 0.0–101.8)

## 2020-01-23 ENCOUNTER — Telehealth (INDEPENDENT_AMBULATORY_CARE_PROVIDER_SITE_OTHER): Payer: BC Managed Care – PPO | Admitting: Psychiatry

## 2020-01-23 ENCOUNTER — Other Ambulatory Visit: Payer: Self-pay

## 2020-01-23 DIAGNOSIS — F411 Generalized anxiety disorder: Secondary | ICD-10-CM | POA: Diagnosis not present

## 2020-01-23 DIAGNOSIS — F319 Bipolar disorder, unspecified: Secondary | ICD-10-CM | POA: Diagnosis not present

## 2020-01-23 NOTE — Progress Notes (Signed)
This is an addendum to the visit of 12/05/2019.  This was a telephone visit today of which the patient was in his home, and I was in mine.

## 2020-01-23 NOTE — Progress Notes (Signed)
BH MD/PA/NP OP Progress Note  01/23/2020 2:24 PM Travis Whitney  MRN:  [id number]  Chief Complaint: Bipolar disorder, generalized anxiety disorder, insomnia, diarrhea, weight loss, mesenteric inflammation with lymphadenopathy    HPI: Patient is seen and examined.  Patient is a 44 year old male with the above-stated past medical and psychiatric history who is seen in follow-up.  This is a telephone visit today and a 2 factor identification process was done.  Greater than 50% of the 20-minute visit was spent on direct patient care and evaluation.  The evaluation was done from my home to the patient's home.  He continues to do well given the circumstances.  His mood has been stable and he has been upbeat about things.  He has seen hematology, and they have ordered a PET scan.  Apparently was scheduled to be today or tomorrow, but the insurance is still in the midst of prior authorization.  The working diagnosis at this point is the possibility of lymphoma versus carcinoid versus an unknown malignancy.  He stated he is prepared himself for the worst, but expects to the best.  He just wants a diagnosis and wants to move on.  As stated above his mood is stable, he sleep is still okay.  He is not having any ups or downs.  His anxiety about his daughter is decreased recently because she decided to go back on the continuous pump.  Additionally his wife is remained on her Trintellix and her mood stays stable.  He stated with regard to his sleep he will take 100 of the Seroquel, and if necessary will take 50 additional milligrams if he does not fall asleep within 30 minutes or so.  No racing thoughts, no pressured speech.  No suicidal or homicidal ideation.  Visit Diagnosis: No diagnosis found.  Past Psychiatric History: See initial evaluation.  Past Medical History: No past medical history on file. No past surgical history on file.  Family Psychiatric History: See initial evaluation.  Family History: No family  history on file.  Social History:  Social History   Socioeconomic History  . Marital status: Married    Spouse name: Not on file  . Number of children: Not on file  . Years of education: Not on file  . Highest education level: Not on file  Occupational History  . Not on file  Tobacco Use  . Smoking status: Never Smoker  Substance and Sexual Activity  . Alcohol use: Yes  . Drug use: Never  . Sexual activity: Yes    Partners: Female  Other Topics Concern  . Not on file  Social History Narrative  . Not on file   Social Determinants of Health   Financial Resource Strain:   . Difficulty of Paying Living Expenses:   Food Insecurity:   . Worried About Programme researcher, broadcasting/film/video in the Last Year:   . Barista in the Last Year:   Transportation Needs:   . Freight forwarder (Medical):   Marland Kitchen Lack of Transportation (Non-Medical):   Physical Activity:   . Days of Exercise per Week:   . Minutes of Exercise per Session:   Stress:   . Feeling of Stress :   Social Connections:   . Frequency of Communication with Friends and Family:   . Frequency of Social Gatherings with Friends and Family:   . Attends Religious Services:   . Active Member of Clubs or Organizations:   . Attends Banker Meetings:   .  Marital Status:     Allergies:  Allergies  Allergen Reactions  . Shellfish-Derived Products Anaphylaxis    Metabolic Disorder Labs: No results found for: HGBA1C, MPG No results found for: PROLACTIN No results found for: CHOL, TRIG, HDL, CHOLHDL, VLDL, LDLCALC No results found for: TSH  Therapeutic Level Labs: No results found for: LITHIUM No results found for: VALPROATE No components found for:  CBMZ  Current Medications: Current Outpatient Medications  Medication Sig Dispense Refill  . buPROPion (WELLBUTRIN) 75 MG tablet TAKE 2 TABLET BY MOUTH EVERY DAY AND 1 TABLET BY MOUTH EVERY NIGHT 90 tablet 4  . EPINEPHrine 0.3 mg/0.3 mL IJ SOAJ injection  SMARTSIG:1 Pre-Filled Pen Syringe SUB-Q Once    . famotidine (PEPCID) 20 MG tablet TK 1 T PO BID PRN 60 tablet 3  . lamoTRIgine (LAMICTAL) 200 MG tablet TK 1 T PO BID 60 tablet 4  . LORazepam (ATIVAN) 0.5 MG tablet TAKE 1 TABLET BY MOUTH TWICE DAILY AS NEEDED FOR ANXIETY AND 1 TABLET BY MOUTH EVERY NIGHT AT BEDTIME AS NEEDED FOR INSOMNIA 60 tablet 2  . OXcarbazepine (TRILEPTAL) 150 MG tablet 2 tabs po q day and 3 tabs po q hs 150 tablet 3  . pantoprazole (PROTONIX) 40 MG tablet TK 1 T PO QD 30 tablet 5  . potassium chloride SA (KLOR-CON) 20 MEQ tablet (2tabs) po twice daily for 3 days then 20 meq(1 tab) po twice daily 60 tablet 1  . predniSONE (DELTASONE) 20 MG tablet Take 60 mg by mouth daily.    . QUEtiapine (SEROQUEL) 100 MG tablet One and one half to two tabs po q hs 60 tablet 2   No current facility-administered medications for this visit.     Musculoskeletal: Strength & Muscle Tone: NA Gait & Station: NA Patient leans: N/A  Psychiatric Specialty Exam: Review of Systems  There were no vitals taken for this visit.There is no height or weight on file to calculate BMI.  General Appearance: NA  Eye Contact:  NA  Speech:  Normal Rate  Volume:  Normal  Mood:  Euthymic  Affect:  Congruent  Thought Process:  Coherent and Descriptions of Associations: Intact  Orientation:  Full (Time, Place, and Person)  Thought Content: Logical   Suicidal Thoughts:  No  Homicidal Thoughts:  No  Memory:  Immediate;   Good Recent;   Good Remote;   Good  Judgement:  Intact  Insight:  Fair  Psychomotor Activity:  NA  Concentration:  Concentration: Good and Attention Span: Good  Recall:  Good  Fund of Knowledge: Good  Language: Good  Akathisia:  Negative  Handed:  Right  AIMS (if indicated): not done  Assets:  Communication Skills Desire for Improvement Financial Resources/Insurance Housing Resilience Social Support Talents/Skills Transportation Vocational/Educational  ADL's:   Intact  Cognition: WNL  Sleep:  Good   Screenings:   Assessment and Plan: Patient is seen and examined.  Patient is a 44 year old male with the above-stated past medical and psychiatric history is seen in follow-up.  His psychiatric situation remains good.  No change in his psychiatric medications.  The mesenteric mass is being worked up by hematology/oncology.  His GI appointment is pending in August some time.  No change in his psychiatric medicines and I will see him back in approximately 1 month to make sure that his mood stays stable through all this upheaval over his physical condition.  Plan: 1. ContinueSeroquel to 100 mg 1and one half to2 tabs p.o. nightly for  mood stability and sleep. 2. Continue Trileptal 450 mg p.o. daily and 300 mg p.o. nightly for mood stability and anxiety. 3. Continue short acting Wellbutrin 75 mg 2 tabs p.o. daily and 1 tab p.o. every afternoon for mood and anxiety. 4. Continue Lamictal 200 mg p.o. twice daily for mood stability. 5. Continue Pepcid 20 mg p.o. twice daily for reflux 6. Continue Protonix 40 mg p.o. daily for reflux disease. 7.ContinueAtivan 0.5 mg p.o. BID anxiety as needed and nightly as needed for anxiety and sleep. 8.  Consultation with hematology/oncology and gastroenterology for radiological findings as well as symptoms. 9.  Follow-up with me in approximately 4 weeks.Antonieta Pert, MD 01/23/2020, 2:24 PM

## 2020-01-24 ENCOUNTER — Ambulatory Visit (HOSPITAL_COMMUNITY): Admission: RE | Admit: 2020-01-24 | Payer: BC Managed Care – PPO | Source: Ambulatory Visit

## 2020-01-30 ENCOUNTER — Telehealth: Payer: Self-pay | Admitting: Hematology

## 2020-01-30 NOTE — Telephone Encounter (Signed)
[id number] Faxed medical records to [facility] @ fax #[phone]

## 2020-01-31 ENCOUNTER — Ambulatory Visit (HOSPITAL_COMMUNITY): Admission: RE | Admit: 2020-01-31 | Payer: BC Managed Care – PPO | Source: Ambulatory Visit

## 2020-01-31 ENCOUNTER — Telehealth: Payer: Self-pay | Admitting: *Deleted

## 2020-01-31 NOTE — Telephone Encounter (Signed)
Contacted patient - Left message on named voice mail. PET now authorized - scheduled for 8/23 at 3 pm per Ladona Ridgel in Nuclear [facility]. Patient is to arrive [facility] at 2:30. NPO except sips of water after 9:30am. Patient encouraged to call if questions.

## 2020-02-01 ENCOUNTER — Ambulatory Visit: Payer: BC Managed Care – PPO | Admitting: Hematology

## 2020-02-06 ENCOUNTER — Ambulatory Visit (HOSPITAL_COMMUNITY)
Admission: RE | Admit: 2020-02-06 | Discharge: 2020-02-06 | Disposition: A | Discharge status: Home / Self Care | Payer: BC Managed Care – PPO | Source: Ambulatory Visit | Attending: Hematology | Admitting: Hematology

## 2020-02-06 ENCOUNTER — Other Ambulatory Visit: Payer: Self-pay

## 2020-02-06 DIAGNOSIS — R590 Localized enlarged lymph nodes: Secondary | ICD-10-CM | POA: Insufficient documentation

## 2020-02-06 LAB — GLUCOSE, CAPILLARY: Glucose-Capillary: 85 mg/dL (ref 70–99)

## 2020-02-06 MED ORDER — FLUDEOXYGLUCOSE F - 18 (FDG) INJECTION
13.9000 | Freq: Once | INTRAVENOUS | Status: AC | PRN
  Administered 2020-02-06: 13.9 via INTRAVENOUS

## 2020-02-08 ENCOUNTER — Encounter: Payer: Self-pay | Admitting: Hematology

## 2020-02-15 ENCOUNTER — Inpatient Hospital Stay: Payer: BC Managed Care – PPO

## 2020-02-15 ENCOUNTER — Inpatient Hospital Stay: Payer: BC Managed Care – PPO | Attending: Hematology | Admitting: Hematology

## 2020-02-15 ENCOUNTER — Other Ambulatory Visit: Payer: Self-pay

## 2020-02-15 VITALS — BP 140/82 | HR 86 | Temp 97.6°F | Resp 18 | Ht 78.0 in | Wt 281.8 lb

## 2020-02-15 DIAGNOSIS — R1031 Right lower quadrant pain: Secondary | ICD-10-CM | POA: Diagnosis not present

## 2020-02-15 DIAGNOSIS — M47816 Spondylosis without myelopathy or radiculopathy, lumbar region: Secondary | ICD-10-CM | POA: Diagnosis not present

## 2020-02-15 DIAGNOSIS — R162 Hepatomegaly with splenomegaly, not elsewhere classified: Secondary | ICD-10-CM | POA: Insufficient documentation

## 2020-02-15 DIAGNOSIS — Z79899 Other long term (current) drug therapy: Secondary | ICD-10-CM | POA: Insufficient documentation

## 2020-02-15 DIAGNOSIS — M5117 Intervertebral disc disorders with radiculopathy, lumbosacral region: Secondary | ICD-10-CM | POA: Insufficient documentation

## 2020-02-15 DIAGNOSIS — E876 Hypokalemia: Secondary | ICD-10-CM | POA: Insufficient documentation

## 2020-02-15 DIAGNOSIS — R590 Localized enlarged lymph nodes: Secondary | ICD-10-CM | POA: Diagnosis present

## 2020-02-15 DIAGNOSIS — R1110 Vomiting, unspecified: Secondary | ICD-10-CM | POA: Insufficient documentation

## 2020-02-15 DIAGNOSIS — R197 Diarrhea, unspecified: Secondary | ICD-10-CM | POA: Diagnosis not present

## 2020-02-15 DIAGNOSIS — R1909 Other intra-abdominal and pelvic swelling, mass and lump: Secondary | ICD-10-CM | POA: Insufficient documentation

## 2020-02-15 DIAGNOSIS — K900 Celiac disease: Secondary | ICD-10-CM | POA: Diagnosis not present

## 2020-02-15 DIAGNOSIS — F319 Bipolar disorder, unspecified: Secondary | ICD-10-CM | POA: Insufficient documentation

## 2020-02-15 DIAGNOSIS — R599 Enlarged lymph nodes, unspecified: Secondary | ICD-10-CM

## 2020-02-15 DIAGNOSIS — M5136 Other intervertebral disc degeneration, lumbar region: Secondary | ICD-10-CM | POA: Diagnosis not present

## 2020-02-15 DIAGNOSIS — R634 Abnormal weight loss: Secondary | ICD-10-CM | POA: Insufficient documentation

## 2020-02-15 DIAGNOSIS — Z6832 Body mass index (BMI) 32.0-32.9, adult: Secondary | ICD-10-CM | POA: Insufficient documentation

## 2020-02-15 DIAGNOSIS — D509 Iron deficiency anemia, unspecified: Secondary | ICD-10-CM | POA: Insufficient documentation

## 2020-02-15 DIAGNOSIS — K760 Fatty (change of) liver, not elsewhere classified: Secondary | ICD-10-CM | POA: Insufficient documentation

## 2020-02-15 DIAGNOSIS — Z8042 Family history of malignant neoplasm of prostate: Secondary | ICD-10-CM | POA: Diagnosis not present

## 2020-02-15 DIAGNOSIS — N4341 Spermatocele of epididymis, single: Secondary | ICD-10-CM | POA: Insufficient documentation

## 2020-02-15 DIAGNOSIS — R5383 Other fatigue: Secondary | ICD-10-CM | POA: Diagnosis not present

## 2020-02-15 DIAGNOSIS — R0789 Other chest pain: Secondary | ICD-10-CM | POA: Insufficient documentation

## 2020-02-15 LAB — IRON AND TIBC
Iron: 14 ug/dL — ABNORMAL LOW (ref 42–163)
Saturation Ratios: 4 % — ABNORMAL LOW (ref 20–55)
TIBC: 375 ug/dL (ref 202–409)
UIBC: 361 ug/dL (ref 117–376)

## 2020-02-15 LAB — VITAMIN B12: Vitamin B-12: 354 pg/mL (ref 180–914)

## 2020-02-15 LAB — MAGNESIUM: Magnesium: 1.7 mg/dL (ref 1.7–2.4)

## 2020-02-15 LAB — POTASSIUM: Potassium: 3.9 mmol/L (ref 3.5–5.1)

## 2020-02-15 LAB — FERRITIN: Ferritin: <4 ng/mL — ABNORMAL LOW (ref 24–336)

## 2020-02-15 LAB — VITAMIN D 25 HYDROXY (VIT D DEFICIENCY, FRACTURES): Vit D, 25-Hydroxy: 33.67 ng/mL (ref 30–100)

## 2020-02-15 NOTE — Progress Notes (Signed)
HEMATOLOGY/ONCOLOGY CONSULTATION NOTE  Date of Service: 02/15/2020  Patient Care Team: Patient, No Pcp Per as PCP - General (General Practice)  CHIEF COMPLAINTS/PURPOSE OF CONSULTATION:  Mesenteric mass  HISTORY OF PRESENTING ILLNESS:  Travis Whitney is a wonderful 44 y.o. male who has been referred to [place] by Dr. Jola Babinski for evaluation and management of mesenteric mass. The pt reports that he is doing well overall.   The pt reports that he is seeing Dr. Jola Babinski for bipolar disorder and denies many other health issues. Pt has no medication allergies, but recently discovered that he is allergic to wasp stings. Pt was stung 6 times and began experiencing chest tightness and shortness of breath. He was given a short-course of steroids. He has no work-related chemical exposure.   He is currently experiencing liquid stools multiple times per day with abdominal cramping. The severity of the diarrhea varies, but is improved with certain foods like rice or potatoes. He has no improvement in abdominal cramping after having a bowel movement. Pt was vomiting a lot when these symptoms first began, but this has mostly resolved. Pt lost nearly thirty pounds over the last 6 weeks.  He has been on Protonix and Pepcid for two years. He has an upcoming appointment with a GI in 01/26/20.  He experienced similar symptoms 5 years ago. He did not have any abdominal pain but had diarrhea that lasted for about 9 months. Over the course of this time period pt lost about 70 lbs. He did not seek any medical assistance, as he thought that he had irritable bowels. Pt has not been able to eat any lactose products since his first episode. Pt admits that his stools never returned to "normal", but were less watery, until now.   He has also been experiencing dark, foul smelling urine after drinking 4-5 liters of water per day. 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 Pt was told that the  spermatocele will be removed if/when it becomes bothersome.  Pt has two herniated discs and Spinal stenosis. He was given steroid injections for back pain earlier this year, prior to the start of his symptoms.      Pt has had CT Abd/Pel completed on 12/03/2019 with results revealing "1.Mesenteric adenopathy and stranding and a large irregular mesenteric lymph node or mass measuring 2.6 x 3.3 cm. Follow-up is recommended to distinguish mesenteric adenitis from lymphoma. 2. L4-L5 and L5-S1 disc herniations."  Pt has had MRI Abd completed on 12/19/2019 with results revealing "1. Mild amount of mesenteric fat stranding seen along the root of the mesentery on the recent CT scan. There are multiple enlarged mesenteric lymph nodes withinthe mid and left lateral abdominal mesentery which are nonspecific and may be reactive in nature due to mesenteric adenitis and/or mesenteric panniculitis although lymphoproliferative disease/lymphoma could also have this appearance. PET/CT scan is recommended for better evaluation. 2. Hepatosplenomegaly with diffuse fatty infiltration of the liver."  On review of systems, pt reports fatigue, abdominal pain, diarrhea, abnormally colored urine, foul smelling urine, unexpected weight loss and denies fevers, chills, rash, night sweats, bloody stools, mucous in stools, headaches, changes in vision, SOB, new lumps/bumps and any other symptoms.   On PMHx the pt reports childhood Asthma, Bipolar disorder, Herniated disc, Spinal stenosis. On Social Hx the pt reports that he is a non-smoker that drinks alcohol occasionally.  On Family Hx the pt reports that his father passed from Prostate Cancer  in his 8770's (Agent Orange exposure), he also had Long QT syndrome. His brother has Sarcoidosis.   INTERVAL HISTORY:  Travis Whitney is a wonderful 44 y.o. male who is here for evaluation and management of mesenteric mass. The patient's last visit with us was on 01/17/2020. The pt reports that he  is doing well overall.  The pt reports that he is still having intermittent diarrhea. He has had no abdominal pain in the last few days. Dr. Joanna PuffSwantkowski will be  referring the patient to a Dietician. He has already started modifying his diet to remove some gluten-containing foods.  Of note since the patient's last visit, pt has had PET/CT ([id number]) completed on 02/06/2020 with results revealing "1. Low-grade Deauville 3 activity in the scattered mildly enlarged mesenteric lymph nodes, maximum SUV of 2.5 which is just above the mediastinal blood pool activity. This is nonspecific and could reflect reactive lymph nodes or a low-grade lymphoproliferative process. I not see a better candidate for biopsy in the neck, chest, abdomen, or pelvis. 2. Small right scrotal hydrocele versus spermatocele. 3. Lower lumbar spondylosis and degenerative disc disease."  Lab results (01/17/20) of CBC w/diff and CMP is as follows: all values are WNL except for Hgb at 11.6, HCT at 38.3, MCV at 72.4, MCH at 21.9, RDW at 17.2, Potassium at 2.9, Calcium at 7.5, Total Protein at 5.1, Albumin at 3.0, ALT at 69, ALP at 167, Total Bilirubin at 0.2. 01/17/2020 Neuron specific Enolase at 9.6 01/17/2020 Chromogranin A at 163.7 01/17/2020 AFP tumor marker at 1.1 01/17/2020 hCG Quant at <1 01/17/2020 Sed Rate at 1 01/17/2020 CA 19-9 at 5 01/17/2020 CEA at 1.11 01/17/2020 LDH at 228 01/17/2020 CRP at 0.6  On review of systems, pt reports improved diarrhea and denies abdominal pain, rash and any other symptoms.   MEDICAL HISTORY:  childhood Asthma, Bipolar disorder, Herniated disc, Spinal stenosis.  SURGICAL HISTORY: No past surgical history on file.  SOCIAL HISTORY: Social History   Socioeconomic History  . Marital status: Married    Spouse name: Not on file  . Number of children: Not on file  . Years of education: Not on file  . Highest education level: Not on file  Occupational History  . Not on file  Tobacco  Use  . Smoking status: Never Smoker  Substance and Sexual Activity  . Alcohol use: Yes  . Drug use: Never  . Sexual activity: Yes    Partners: Female  Other Topics Concern  . Not on file  Social History Narrative  . Not on file   Social Determinants of Health   Financial Resource Strain:   . Difficulty of Paying Living Expenses: Not on file  Food Insecurity:   . Worried About Programme researcher, broadcasting/film/videounning Out of Food in the Last Year: Not on file  . Ran Out of Food in the Last Year: Not on file  Transportation Needs:   . Lack of Transportation (Medical): Not on file  . Lack of Transportation (Non-Medical): Not on file  Physical Activity:   . Days of Exercise per Week: Not on file  . Minutes of Exercise per Session: Not on file  Stress:   . Feeling of Stress : Not on file  Social Connections:   . Frequency of Communication with Friends and Family: Not on file  . Frequency of Social Gatherings with Friends and Family: Not on file  . Attends Religious Services: Not on file  . Active Member of Clubs or Organizations: Not on file  .  Attends Banker Meetings: Not on file  . Marital Status: Not on file  Intimate Partner Violence:   . Fear of Current or Ex-Partner: Not on file  . Emotionally Abused: Not on file  . Physically Abused: Not on file  . Sexually Abused: Not on file    FAMILY HISTORY: No family history on file.  ALLERGIES:  is allergic to shellfish-derived products.  MEDICATIONS:  Current Outpatient Medications  Medication Sig Dispense Refill  . buPROPion (WELLBUTRIN) 75 MG tablet TAKE 2 TABLET BY MOUTH EVERY DAY AND 1 TABLET BY MOUTH EVERY NIGHT 90 tablet 4  . EPINEPHrine 0.3 mg/0.3 mL IJ SOAJ injection SMARTSIG:1 Pre-Filled Pen Syringe SUB-Q Once    . famotidine (PEPCID) 20 MG tablet TK 1 T PO BID PRN 60 tablet 3  . lamoTRIgine (LAMICTAL) 200 MG tablet TK 1 T PO BID 60 tablet 4  . LORazepam (ATIVAN) 0.5 MG tablet TAKE 1 TABLET BY MOUTH TWICE DAILY AS NEEDED FOR ANXIETY  AND 1 TABLET BY MOUTH EVERY NIGHT AT BEDTIME AS NEEDED FOR INSOMNIA 60 tablet 2  . OXcarbazepine (TRILEPTAL) 150 MG tablet 2 tabs po q day and 3 tabs po q hs 150 tablet 3  . pantoprazole (PROTONIX) 40 MG tablet TK 1 T PO QD 30 tablet 5  . potassium chloride SA (KLOR-CON) 20 MEQ tablet (2tabs) po twice daily for 3 days then 20 meq(1 tab) po twice daily 60 tablet 1  . predniSONE (DELTASONE) 20 MG tablet Take 60 mg by mouth daily.    . QUEtiapine (SEROQUEL) 100 MG tablet One and one half to two tabs po q hs 60 tablet 2   No current facility-administered medications for this visit.    REVIEW OF SYSTEMS:   A 10+ POINT REVIEW OF SYSTEMS WAS OBTAINED including neurology, dermatology, psychiatry, cardiac, respiratory, lymph, extremities, GI, GU, Musculoskeletal, constitutional, breasts, reproductive, HEENT.  All pertinent positives are noted in the HPI.  All others are negative.   PHYSICAL EXAMINATION: ECOG PERFORMANCE STATUS: 1 - Symptomatic but completely ambulatory  . Vitals:   02/15/20 1143  BP: 140/82  Pulse: 86  Resp: 18  Temp: 97.6 F (36.4 C)  SpO2: 99%   Filed Weights   02/15/20 1143  Weight: 281 lb 12.8 oz (127.8 kg)   .Body mass index is 32.57 kg/m.   GENERAL:alert, in no acute distress and comfortable SKIN: no acute rashes, no significant lesions EYES: conjunctiva are pink and non-injected, sclera anicteric OROPHARYNX: MMM, no exudates, no oropharyngeal erythema or ulceration NECK: supple, no JVD LYMPH:  no palpable lymphadenopathy in the cervical, axillary or inguinal regions LUNGS: clear to auscultation b/l with normal respiratory effort HEART: regular rate & rhythm ABDOMEN:  normoactive bowel sounds , non tender, not distended. No palpable hepatosplenomegaly.  Extremity: no pedal edema PSYCH: alert & oriented x 3 with fluent speech NEURO: no focal motor/sensory deficits  LABORATORY DATA:  I have reviewed the data as listed  . CBC Latest Ref Rng & Units  01/17/2020  WBC 4.0 - 10.5 K/uL 6.7  Hemoglobin 13.0 - 17.0 g/dL 11.6(L)  Hematocrit 39 - 52 % 38.3(L)  Platelets 150 - 400 K/uL 340    . CMP Latest Ref Rng & Units 01/17/2020  Glucose 70 - 99 mg/dL 98  BUN 6 - 20 mg/dL 10  Creatinine 4.33 - 2.95 mg/dL 1.88  Sodium 416 - 606 mmol/L 142  Potassium 3.5 - 5.1 mmol/L 2.9(LL)  Chloride 98 - 111 mmol/L 105  CO2  22 - 32 mmol/L 28  Calcium 8.9 - 10.3 mg/dL 7.5(L)  Total Protein 6.5 - 8.1 g/dL 5.1(L)  Total Bilirubin 0.3 - 1.2 mg/dL 4.0(C)  Alkaline Phos 38 - 126 U/L 167(H)  AST 15 - 41 U/L 31  ALT 0 - 44 U/L 69(H)     RADIOGRAPHIC STUDIES: I have personally reviewed the radiological images as listed and agreed with the findings in the report. NM PET Image Initial (PI) Skull Base To Thigh  Result Date: 02/06/2020 CLINICAL DATA:  Initial treatment strategy for adenopathy concerning for non-Hodgkin's lymphoma. EXAM: NUCLEAR MEDICINE PET SKULL BASE TO THIGH TECHNIQUE: 13.9 mCi F-18 FDG was injected intravenously. Full-ring PET imaging was performed from the skull base to thigh after the radiotracer. CT data was obtained and used for attenuation correction and anatomic localization. Fasting blood glucose: 85 mg/dl COMPARISON:  None. FINDINGS: Mediastinal blood pool activity: SUV max 2.3 Liver activity: SUV max 3.2 NECK: Symmetric activity along the palatine tonsils, maximum SUV 5.6 on the left 6.1 on the right. No pathologically enlarged adenopathy in the neck. A 0.7 cm left level IIa lymph node on image 29/4 has maximum SUV of 2.7, minimally above blood pool, Deauville 3. Incidental CT findings: none CHEST: No significant abnormal hypermetabolic activity in this region. Incidental CT findings: none ABDOMEN/PELVIS: There are some scattered mesenteric lymph nodes which are mildly prominent in size and number, including a 1.4 cm in short axis left upper quadrant mesenteric lymph node on image 140/4 with maximum SUV of 2.5, Deauville 3. Physiologic activity  in bowel. Aside from the mesenteric lymph nodes, no additional significant enlarged nodes are observed in the abdomen/pelvis. Incidental CT findings: 3.8 by 2.9 cm fluid density lesion in the right scrotum, potentially a small hydrocele or spermatocele. SKELETON: No significant abnormal hypermetabolic activity in this region. Incidental CT findings: Lower lumbar spondylosis and degenerative disc disease with impingement at L4-5. IMPRESSION: 1. Low-grade Deauville 3 activity in the scattered mildly enlarged mesenteric lymph nodes, maximum SUV of 2.5 which is just above the mediastinal blood pool activity. This is nonspecific and could reflect reactive lymph nodes or a low-grade lymphoproliferative process. I not see a better candidate for biopsy in the neck, chest, abdomen, or pelvis. 2. Small right scrotal hydrocele versus spermatocele. 3. Lower lumbar spondylosis and degenerative disc disease. Electronically Signed   By: Gaylyn Rong M.D.   On: 02/06/2020 16:56    ASSESSMENT & PLAN:   44 yo with   1) Mesenteric Lymphadenopathy  Likely related to uncontrolled untreated newly diagnosed celiac disease. Cannot r/o low grade lymphoproliferative disorder. Other tumor markers unrevealing. PLAN: -Discussed pt labwork today, 02/15/20; microcytic mild anemia, low Potassium, Chromogranin A & LDH are slightly elevated; CA, CEA, Neuron specific Enolase, hCG Quant, AFP tumor marker, Sed Rate, & CRP are WNL -Discussed 02/06/2020 PET/CT ([id number]) which revealed "1. Low-grade Deauville 3 activity in the scattered mildly enlarged mesenteric lymph nodes, maximum SUV of 2.5 which is just above the mediastinal blood pool activity. This is nonspecific and could reflect reactive lymph nodes or a low-grade lymphoproliferative process." -Based on labs and radiographic evidence lymph nodes appear reactive. -Advised pt that lymph nodes will be hard to biopsy due to size and location. No indication to get a surgical  biopsy at this time.  -Spoke with Dr. Joanna Puff at [facility] - who completed Endoscopy & Colonoscopy which revealed untreated Celiac Disease. Dr. Joanna Puff plans on repeating a small bowel follow-through to r/o small bowel lymphoma that  can be related to untreated celiac disease -Advised pt that Celiac Disease can result in nutritional deficiencies, most likely Iron & B Vitamins, which is in keeping with microcytosis. -Advised pt that long-standing untreated Celiac Disease increases the risk of small bowel tumors. Continued monitoring is recommended. -Advised pt that diarrhea should continue to improve with gluten-free diet. Recommend pt f/u with the Dietician as scheduled. -Recommend pt continue 20 meq Potassium BID. F/u with PCP for Potassium monitoring. -Will get addition labs today to r/o nutritional deficiencies. -Will repeat CT scans in 18 weeks -Will see back in 5 months with labs  2. Hypokalemia -- resolved with po potassium and improvement of diarrhea on gluten free diet.  3. Severe iron deficiency with mild anemia . Lab Results  Component Value Date   IRON 14 (L) 02/15/2020   TIBC 375 02/15/2020   IRONPCTSAT 4 (L) 02/15/2020   (Iron and TIBC)  Lab Results  Component Value Date   FERRITIN <4 (L) 02/15/2020   Hgb . Lab Results  Component Value Date   HGB 11.6 (L) 01/17/2020   PLAN -start on po Iron polysaccharide 150mg  po daily -will recheck Iron labs on followup  FOLLOW UP: Labs today CT abd/pelvis in 18 weeks RTC with Dr with labs in 20 weeks   The total time spent in the appt was 30 minutes and more than 50% was on counseling and direct patient cares.  All of the patient's questions were answered with apparent satisfaction. The patient knows to call the clinic with any problems, questions or concerns.    Candise Che MD MS AAHIVMS [facility] Hematology/Oncology Physician [facility]  (Office):        [phone] (Work cell):  [phone] (Fax):           [phone]  02/15/2020 12:21 PM  I, 04/16/2020, am acting as a scribe for Dr. Carollee Herter.   .I have reviewed the above documentation for accuracy and completeness, and I agree with the above. Wyvonnia Lora MD

## 2020-02-21 MED ORDER — POLYSACCHARIDE IRON COMPLEX 150 MG PO CAPS: 150.0000 mg | ORAL_CAPSULE | Freq: Every day | ORAL | 5 refills | Status: AC

## 2020-02-24 ENCOUNTER — Telehealth: Payer: Self-pay | Admitting: *Deleted

## 2020-02-24 NOTE — Telephone Encounter (Signed)
-----   Message from Gautam Kishore Kale, MD sent at 02/21/2020 11:33 PM EDT ----- Severe iron deficiency -- would recommend taking po Iron polysaccharide 150mg po daily and increase iron rich foods. 

## 2020-02-24 NOTE — Telephone Encounter (Signed)
Contacted patient regarding test results per Dr. Kale's directions. Patient verbalized understanding  

## 2020-02-28 ENCOUNTER — Telehealth: Payer: Self-pay | Admitting: *Deleted

## 2020-02-28 NOTE — Telephone Encounter (Signed)
-----   Message from Johney Maine, MD sent at 02/21/2020 11:33 PM EDT ----- Severe iron deficiency -- would recommend taking po Iron polysaccharide 150mg  po daily and increase iron rich foods.

## 2020-02-28 NOTE — Telephone Encounter (Signed)
Contacted patient regarding test results per Dr. Clyda Greener directions. Patient verbalized understanding. He asked if he should continue potassium supplement? Dr. Candise Che response: F/U with PCP regarding potassium - was originally prescribed due to potassium loss r/t diarrhea. PCP can check labs and assess. LVM for patient with this information and encouraged to contact office for questions.

## 2020-03-01 ENCOUNTER — Other Ambulatory Visit: Payer: Self-pay

## 2020-03-01 ENCOUNTER — Telehealth (INDEPENDENT_AMBULATORY_CARE_PROVIDER_SITE_OTHER): Payer: BC Managed Care – PPO | Admitting: Psychiatry

## 2020-03-01 DIAGNOSIS — F411 Generalized anxiety disorder: Secondary | ICD-10-CM

## 2020-03-01 DIAGNOSIS — F3189 Other bipolar disorder: Secondary | ICD-10-CM

## 2020-03-01 MED ORDER — LAMOTRIGINE 200 MG PO TABS
ORAL_TABLET | ORAL | 4 refills | Status: DC
Start: 2020-03-01 — End: 2020-07-17

## 2020-03-01 MED ORDER — LORAZEPAM 0.5 MG PO TABS
ORAL_TABLET | ORAL | 2 refills | Status: DC
Start: 2020-03-01 — End: 2020-05-22

## 2020-03-02 NOTE — Progress Notes (Signed)
BH MD/PA/NP OP Progress Note  03/02/2020 1:20 PM Travis Whitney  MRN:  [id number]  Chief Complaint:  Bipolar disorder, generalized anxiety disorder, insomnia, diarrhea, weight loss, mesenteric inflammation with lymphadenopathy    HPI: Patient is seen and examined. Patient is a 44 year old male with the above-stated past medical and psychiatric history who is seen in follow-up. This is a telephone visit today and a 2 factor identification process was done. Greater than 50% of the 20-minute visit was spent on direct patient care and evaluation.  The evaluation was done from my office in the hospital and his home.  Patient stated he was doing okay but under stress.  His wife is a Engineer, civil (consulting) in school, and she is apparently exposed to multiple children who are positive for Covid.  She is neither he nor his wife are vaccinated for Covid.  We discussed that today.  He actually slept in his truck for a few days because of her exposures.  We discussed the outcome of his lymphadenopathy and abnormal CT scan.  Review of his PET scan was done with the patient the day afterwards.  The scan was essentially negative for hotspot in his abdomen.  Oncology/hematology evaluated this with the patient, and they felt as though that the inflammatory process may be secondary to his recently diagnosed celiac disease.  There was no evidence of lymphoma or cancer at this point.  He stated the gastroenterology folks want to scope him from above again, and get into his duodenum to see if there is any process going on there.  He is a bit anxious about that, but has gained a significant of his weight back.  His sleep is relatively good.  He stated that he felt less anxious after our discussion today about the Covid vaccine.  I told him that I felt as though given the number of exposures that his wife has, and his by definition immune-based disease with celiac disease it would be beneficial to receive the vaccination.  He denied any racing  thoughts or pressured speech.  His sleep is adequate.  His weight gain has come back pretty significantly after the work-up of his inflammatory process was done.  We discussed being cautious with the gluten-free diet because of the excessive number of carbohydrates that are present.  He denied any side effects to his current medications.  Visit Diagnosis: No diagnosis found.  Past Psychiatric History: See initial evaluation.  Past Medical History: No past medical history on file. No past surgical history on file.  Family Psychiatric History: See initial evaluation.  Family History: No family history on file.  Social History:  Social History   Socioeconomic History  . Marital status: Married    Spouse name: Not on file  . Number of children: Not on file  . Years of education: Not on file  . Highest education level: Not on file  Occupational History  . Not on file  Tobacco Use  . Smoking status: Never Smoker  Substance and Sexual Activity  . Alcohol use: Yes  . Drug use: Never  . Sexual activity: Yes    Partners: Female  Other Topics Concern  . Not on file  Social History Narrative  . Not on file   Social Determinants of Health   Financial Resource Strain:   . Difficulty of Paying Living Expenses: Not on file  Food Insecurity:   . Worried About Programme researcher, broadcasting/film/video in the Last Year: Not on file  . Ran Out of  Food in the Last Year: Not on file  Transportation Needs:   . Lack of Transportation (Medical): Not on file  . Lack of Transportation (Non-Medical): Not on file  Physical Activity:   . Days of Exercise per Week: Not on file  . Minutes of Exercise per Session: Not on file  Stress:   . Feeling of Stress : Not on file  Social Connections:   . Frequency of Communication with Friends and Family: Not on file  . Frequency of Social Gatherings with Friends and Family: Not on file  . Attends Religious Services: Not on file  . Active Member of Clubs or Organizations: Not  on file  . Attends Banker Meetings: Not on file  . Marital Status: Not on file    Allergies:  Allergies  Allergen Reactions  . Shellfish-Derived Products Anaphylaxis    Metabolic Disorder Labs: No results found for: HGBA1C, MPG No results found for: PROLACTIN No results found for: CHOL, TRIG, HDL, CHOLHDL, VLDL, LDLCALC No results found for: TSH  Therapeutic Level Labs: No results found for: LITHIUM No results found for: VALPROATE No components found for:  CBMZ  Current Medications: Current Outpatient Medications  Medication Sig Dispense Refill  . buPROPion (WELLBUTRIN) 75 MG tablet TAKE 2 TABLET BY MOUTH EVERY DAY AND 1 TABLET BY MOUTH EVERY NIGHT 90 tablet 4  . EPINEPHrine 0.3 mg/0.3 mL IJ SOAJ injection SMARTSIG:1 Pre-Filled Pen Syringe SUB-Q Once    . famotidine (PEPCID) 20 MG tablet TK 1 T PO BID PRN 60 tablet 3  . iron polysaccharides (NIFEREX) 150 MG capsule Take 1 capsule (150 mg total) by mouth daily. 30 capsule 5  . lamoTRIgine (LAMICTAL) 200 MG tablet TK 1 T PO BID 60 tablet 4  . LORazepam (ATIVAN) 0.5 MG tablet TAKE 1 TABLET BY MOUTH TWICE DAILY AS NEEDED FOR ANXIETY AND 1 TABLET BY MOUTH EVERY NIGHT AT BEDTIME AS NEEDED FOR INSOMNIA 60 tablet 2  . OXcarbazepine (TRILEPTAL) 150 MG tablet 2 tabs po q day and 3 tabs po q hs 150 tablet 3  . pantoprazole (PROTONIX) 40 MG tablet TK 1 T PO QD 30 tablet 5  . potassium chloride SA (KLOR-CON) 20 MEQ tablet (2tabs) po twice daily for 3 days then 20 meq(1 tab) po twice daily 60 tablet 1  . predniSONE (DELTASONE) 20 MG tablet Take 60 mg by mouth daily.    . QUEtiapine (SEROQUEL) 100 MG tablet One and one half to two tabs po q hs 60 tablet 2   No current facility-administered medications for this visit.     Musculoskeletal: Strength & Muscle Tone: NA Gait & Station: NA Patient leans: N/A  Psychiatric Specialty Exam: Review of Systems  There were no vitals taken for this visit.There is no height or  weight on file to calculate BMI.  General Appearance: NA  Eye Contact:  NA  Speech:  Normal Rate  Volume:  Normal  Mood:  Anxious  Affect:  Congruent  Thought Process:  Coherent and Descriptions of Associations: Intact  Orientation:  Full (Time, Place, and Person)  Thought Content: Logical   Suicidal Thoughts:  No  Homicidal Thoughts:  No  Memory:  Immediate;   Good Recent;   Good Remote;   Good  Judgement:  Intact  Insight:  Fair  Psychomotor Activity:  NA  Concentration:  Concentration: Good and Attention Span: Good  Recall:  Good  Fund of Knowledge: Good  Language: Good  Akathisia:  Negative  Handed:  Right  AIMS (if indicated): not done  Assets:  Communication Skills Desire for Improvement Financial Resources/Insurance Housing Resilience Social Support Talents/Skills Transportation Vocational/Educational  ADL's:  Intact  Cognition: WNL  Sleep:  Fair   Screenings:   Assessment and Plan: Patient is seen and examined.  Patient is a 44 year old male with the above-stated past psychiatric and medical history seen in follow-up.  He is doing fairly well except for his anxiety about Covid.  The good news was that his hematological and oncological work-up was all negative.  He feels better about that.  His GI symptoms have improved on the gluten-free diet as well as the knowledge that he does not have lymphoma.  He is apparently going to get scoped from above one more time to look into his duodenum.  No change in his current medications, and I did recommend that both he and his wife receive the COVID-19 vaccination.  I will see him back in a month.  Plan: 1. ContinueSeroquel to 100 mg 1and one half to2 tabs p.o. nightly for mood stability and sleep. 2. Continue Trileptal 450 mg p.o. daily and 300 mg p.o. nightly for mood stability and anxiety. 3. Continue short acting Wellbutrin 75 mg 2 tabs p.o. daily and 1 tab p.o. every afternoon for mood and anxiety. 4. Continue  Lamictal 200 mg p.o. twice daily for mood stability. 5. Continue Pepcid 20 mg p.o. twice daily for reflux 6. Continue Protonix 40 mg p.o. daily for reflux disease. 7.ContinueAtivan 0.5 mg p.o. BID anxiety as needed and nightly as needed for anxiety and sleep. 8. Follow-up with me in approximately 4 weeks.Antonieta Pert, MD 03/02/2020, 1:20 PM

## 2020-04-26 ENCOUNTER — Other Ambulatory Visit (HOSPITAL_COMMUNITY): Payer: Self-pay | Admitting: Psychiatry

## 2020-05-22 ENCOUNTER — Other Ambulatory Visit: Payer: Self-pay

## 2020-05-22 ENCOUNTER — Telehealth (INDEPENDENT_AMBULATORY_CARE_PROVIDER_SITE_OTHER): Payer: BC Managed Care – PPO | Admitting: Psychiatry

## 2020-05-22 DIAGNOSIS — F3110 Bipolar disorder, current episode manic without psychotic features, unspecified: Secondary | ICD-10-CM | POA: Diagnosis not present

## 2020-05-22 DIAGNOSIS — F411 Generalized anxiety disorder: Secondary | ICD-10-CM | POA: Diagnosis not present

## 2020-05-22 MED ORDER — OXCARBAZEPINE 150 MG PO TABS
ORAL_TABLET | ORAL | 3 refills | Status: DC
Start: 2020-05-22 — End: 2020-08-28

## 2020-05-22 MED ORDER — LORAZEPAM 0.5 MG PO TABS: ORAL_TABLET | ORAL | 2 refills | Status: AC

## 2020-05-22 MED ORDER — QUETIAPINE FUMARATE 100 MG PO TABS
ORAL_TABLET | ORAL | 4 refills | Status: DC
Start: 2020-05-22 — End: 2020-06-26

## 2020-05-22 NOTE — Progress Notes (Signed)
BH MD/PA/NP OP Progress Note  05/22/2020 2:01 PM Travis Whitney  MRN:  [id number]  Chief Complaint:  Bipolar disorder, generalized anxiety disorder, insomnia, diarrhea, weight loss, mesenteric inflammation with lymphadenopathy  HPI: Patient is seen and examined. Patient is a 44 year old male with the above-stated past medical and psychiatric history who is seen in follow-up. This is a telephone visit today and a 2 factor identification process was done. Greater than 50% of the 20-minute visit was spent on direct patient care and evaluation.  The evaluation was done from my office and he was in his home.  Patient gave verbal authorization for the phone call visit.  He stated he is doing well.  He stated that he was not having any problems, and his mood was good.  His sleep is good.  He continues to work too much, and we discussed that again.  His sleep is stable.  The biggest issues in his life right now are working too hard, keeping his wife on antidepressant medication, and trying to get his daughter improved after her suicide attempt earlier this year.  His wife is doing well when she is consistent with her medications, and they are seeking a therapist for his daughter currently.  No other new medical issues.  No side effects of medication. Visit Diagnosis: No diagnosis found.  Past Psychiatric History: See initial evaluation.  Past Medical History: No past medical history on file. No past surgical history on file.  Family Psychiatric History: See initial evaluation.  Family History: No family history on file.  Social History:  Social History   Socioeconomic History  . Marital status: Married    Spouse name: Not on file  . Number of children: Not on file  . Years of education: Not on file  . Highest education level: Not on file  Occupational History  . Not on file  Tobacco Use  . Smoking status: Never Smoker  Substance and Sexual Activity  . Alcohol use: Yes  . Drug use: Never   . Sexual activity: Yes    Partners: Female  Other Topics Concern  . Not on file  Social History Narrative  . Not on file   Social Determinants of Health   Financial Resource Strain:   . Difficulty of Paying Living Expenses: Not on file  Food Insecurity:   . Worried About Programme researcher, broadcasting/film/video in the Last Year: Not on file  . Ran Out of Food in the Last Year: Not on file  Transportation Needs:   . Lack of Transportation (Medical): Not on file  . Lack of Transportation (Non-Medical): Not on file  Physical Activity:   . Days of Exercise per Week: Not on file  . Minutes of Exercise per Session: Not on file  Stress:   . Feeling of Stress : Not on file  Social Connections:   . Frequency of Communication with Friends and Family: Not on file  . Frequency of Social Gatherings with Friends and Family: Not on file  . Attends Religious Services: Not on file  . Active Member of Clubs or Organizations: Not on file  . Attends Banker Meetings: Not on file  . Marital Status: Not on file    Allergies:  Allergies  Allergen Reactions  . Shellfish-Derived Products Anaphylaxis    Metabolic Disorder Labs: No results found for: HGBA1C, MPG No results found for: PROLACTIN No results found for: CHOL, TRIG, HDL, CHOLHDL, VLDL, LDLCALC No results found for: TSH  Therapeutic Level Labs:  No results found for: LITHIUM No results found for: VALPROATE No components found for:  CBMZ  Current Medications: Current Outpatient Medications  Medication Sig Dispense Refill  . buPROPion (WELLBUTRIN) 75 MG tablet TAKE 2 TABLET BY MOUTH EVERY DAY AND 1 TABLET BY MOUTH EVERY NIGHT 90 tablet 4  . EPINEPHrine 0.3 mg/0.3 mL IJ SOAJ injection SMARTSIG:1 Pre-Filled Pen Syringe SUB-Q Once    . famotidine (PEPCID) 20 MG tablet TK 1 T PO BID PRN 60 tablet 3  . iron polysaccharides (NIFEREX) 150 MG capsule Take 1 capsule (150 mg total) by mouth daily. 30 capsule 5  . lamoTRIgine (LAMICTAL) 200 MG  tablet TK 1 T PO BID 60 tablet 4  . LORazepam (ATIVAN) 0.5 MG tablet TAKE 1 TABLET BY MOUTH TWICE DAILY AS NEEDED FOR ANXIETY AND 1 TABLET BY MOUTH EVERY NIGHT AT BEDTIME AS NEEDED FOR INSOMNIA 60 tablet 2  . OXcarbazepine (TRILEPTAL) 150 MG tablet 2 tabs po q day and 3 tabs po q hs 150 tablet 3  . pantoprazole (PROTONIX) 40 MG tablet TK 1 T PO QD 30 tablet 5  . potassium chloride SA (KLOR-CON) 20 MEQ tablet (2tabs) po twice daily for 3 days then 20 meq(1 tab) po twice daily 60 tablet 1  . predniSONE (DELTASONE) 20 MG tablet Take 60 mg by mouth daily.    . QUEtiapine (SEROQUEL) 100 MG tablet One and one half to two tabs po q hs 60 tablet 2  . QUEtiapine (SEROQUEL) 50 MG tablet TAKE 1 TO 2 TABLETS BY MOUTH AT BEDTIME AS NEEDED 60 tablet 2   No current facility-administered medications for this visit.     Musculoskeletal: Strength & Muscle Tone: NA Gait & Station: NA Patient leans: N/A  Psychiatric Specialty Exam: Review of Systems  There were no vitals taken for this visit.There is no height or weight on file to calculate BMI.  General Appearance: NA  Eye Contact:  NA  Speech:  Normal Rate  Volume:  Normal  Mood:  Euthymic  Affect:  Congruent  Thought Process:  Coherent and Descriptions of Associations: Intact  Orientation:  Full (Time, Place, and Person)  Thought Content: Logical   Suicidal Thoughts:  No  Homicidal Thoughts:  No  Memory:  Immediate;   Good Recent;   Good Remote;   Good  Judgement:  Intact  Insight:  Good  Psychomotor Activity:  Normal  Concentration:  Concentration: Good and Attention Span: Good  Recall:  Good  Fund of Knowledge: Good  Language: Good  Akathisia:  Negative  Handed:  Right  AIMS (if indicated): not done  Assets:  Desire for Improvement Financial Resources/Insurance Housing Resilience Social Support Talents/Skills Transportation Vocational/Educational  ADL's:  Intact  Cognition: WNL  Sleep:  Good    Screenings:   Assessment and Plan: Patient is seen and examined.  Patient is a 44 year old male with the above-stated past psychiatric history seen in follow-up.  He is doing well.  Sleep is good.  No evidence of mania, hypomania or depression.  No change in his medications.  I will see him back in February 2022.  Plan: 1. ContinueSeroquel to 100 mg 1and one half to2 tabs p.o. nightly for mood stability and sleep. 2. Continue Trileptal 450 mg p.o. daily and 300 mg p.o. nightly for mood stability and anxiety. 3. Continue short acting Wellbutrin 75 mg 2 tabs p.o. daily and 1 tab p.o. every afternoon for mood and anxiety. 4. Continue Lamictal 200 mg p.o. twice daily for  mood stability. 5. Continue Pepcid 20 mg p.o. twice daily for reflux 6. Continue Protonix 40 mg p.o. daily for reflux disease. 7.ContinueAtivan 0.5 mg p.o. BID anxiety as needed and nightly as needed for anxiety and sleep. 8. Follow-up with me in February 2022.Antonieta Pert, MD 05/22/2020, 2:01 PM

## 2020-06-22 ENCOUNTER — Other Ambulatory Visit (HOSPITAL_COMMUNITY): Payer: Self-pay | Admitting: Psychiatry

## 2020-06-23 ENCOUNTER — Other Ambulatory Visit (HOSPITAL_COMMUNITY): Payer: Self-pay | Admitting: Psychiatry

## 2020-06-23 MED ORDER — PANTOPRAZOLE SODIUM 40 MG PO TBEC
DELAYED_RELEASE_TABLET | ORAL | 5 refills | Status: DC
Start: 2020-06-23 — End: 2020-08-28

## 2020-06-25 ENCOUNTER — Other Ambulatory Visit (HOSPITAL_COMMUNITY): Payer: Self-pay | Admitting: Psychiatry

## 2020-06-26 ENCOUNTER — Other Ambulatory Visit (HOSPITAL_COMMUNITY): Payer: Self-pay | Admitting: Psychiatry

## 2020-06-26 ENCOUNTER — Telehealth: Payer: Self-pay | Admitting: *Deleted

## 2020-06-26 ENCOUNTER — Ambulatory Visit (HOSPITAL_COMMUNITY): Payer: BC Managed Care – PPO

## 2020-06-26 MED ORDER — QUETIAPINE FUMARATE 100 MG PO TABS
ORAL_TABLET | ORAL | 4 refills | Status: AC
Start: 2020-06-26 — End: ?

## 2020-06-26 NOTE — Telephone Encounter (Signed)
Contacted patient - LVM with following information: currently has lab appt on 1/17 followed by appt with Dr. Candise Che. Has CT on 1/20. Appointments need to be rescheduled. Please call office.

## 2020-06-29 ENCOUNTER — Telehealth: Payer: Self-pay | Admitting: Hematology

## 2020-06-29 NOTE — Telephone Encounter (Signed)
Rescheduled 01/17 appointment to 02/14 due to weather and provider orders, patient has been called and notified.

## 2020-07-02 ENCOUNTER — Inpatient Hospital Stay: Payer: BC Managed Care – PPO

## 2020-07-02 ENCOUNTER — Inpatient Hospital Stay: Payer: BC Managed Care – PPO | Admitting: Hematology

## 2020-07-05 ENCOUNTER — Ambulatory Visit (HOSPITAL_COMMUNITY)
Admission: RE | Admit: 2020-07-05 | Discharge: 2020-07-05 | Disposition: A | Discharge status: Home / Self Care | Payer: BC Managed Care – PPO | Source: Ambulatory Visit | Attending: Hematology | Admitting: Hematology

## 2020-07-05 ENCOUNTER — Encounter (HOSPITAL_COMMUNITY): Payer: Self-pay

## 2020-07-05 ENCOUNTER — Other Ambulatory Visit: Payer: Self-pay

## 2020-07-05 DIAGNOSIS — R599 Enlarged lymph nodes, unspecified: Secondary | ICD-10-CM | POA: Diagnosis present

## 2020-07-05 MED ORDER — IOHEXOL 300 MG/ML  SOLN
100.0000 mL | Freq: Once | INTRAMUSCULAR | Status: AC | PRN
  Administered 2020-07-05: 100 mL via INTRAVENOUS

## 2020-07-17 ENCOUNTER — Other Ambulatory Visit (HOSPITAL_COMMUNITY): Payer: Self-pay | Admitting: *Deleted

## 2020-07-17 MED ORDER — LAMOTRIGINE 200 MG PO TABS
ORAL_TABLET | ORAL | 3 refills | Status: DC
Start: 2020-07-17 — End: 2020-08-28

## 2020-07-29 NOTE — Progress Notes (Signed)
HEMATOLOGY/ONCOLOGY CONSULTATION NOTE  Date of Service: 07/30/2020  Patient Care Team: Sable Feil, DO as PCP - General (Family Medicine)  CHIEF COMPLAINTS/PURPOSE OF CONSULTATION:  Mesenteric mass  HISTORY OF PRESENTING ILLNESS:  Travis Whitney is a wonderful 45 y.o. male who has been referred to [place] by Dr. Jola Babinski for evaluation and management of mesenteric mass. The pt reports that he is doing well overall.   The pt reports that he is seeing Dr. Jola Babinski for bipolar disorder and denies many other health issues. Pt has no medication allergies, but recently discovered that he is allergic to wasp stings. Pt was stung 6 times and began experiencing chest tightness and shortness of breath. He was given a short-course of steroids. He has no work-related chemical exposure.   He is currently experiencing liquid stools multiple times per day with abdominal cramping. The severity of the diarrhea varies, but is improved with certain foods like rice or potatoes. He has no improvement in abdominal cramping after having a bowel movement. Pt was vomiting a lot when these symptoms first began, but this has mostly resolved. Pt lost nearly thirty pounds over the last 6 weeks.  He has been on Protonix and Pepcid for two years. He has an upcoming appointment with a GI in 01/26/20.  He experienced similar symptoms 5 years ago. He did not have any abdominal pain but had diarrhea that lasted for about 9 months. Over the course of this time period pt lost about 70 lbs. He did not seek any medical assistance, as he thought that he had irritable bowels. Pt has not been able to eat any lactose products since his first episode. Pt admits that his stools never returned to "normal", but were less watery, until now.   He has also been experiencing dark, foul smelling urine after drinking 4-5 liters of water per day. 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 Pt was told that the  spermatocele will be removed if/when it becomes bothersome.  Pt has two herniated discs and Spinal stenosis. He was given steroid injections for back pain earlier this year, prior to the start of his symptoms.      Pt has had CT Abd/Pel completed on 12/03/2019 with results revealing "1.Mesenteric adenopathy and stranding and a large irregular mesenteric lymph node or mass measuring 2.6 x 3.3 cm. Follow-up is recommended to distinguish mesenteric adenitis from lymphoma. 2. L4-L5 and L5-S1 disc herniations."  Pt has had MRI Abd completed on 12/19/2019 with results revealing "1. Mild amount of mesenteric fat stranding seen along the root of the mesentery on the recent CT scan. There are multiple enlarged mesenteric lymph nodes withinthe mid and left lateral abdominal mesentery which are nonspecific and may be reactive in nature due to mesenteric adenitis and/or mesenteric panniculitis although lymphoproliferative disease/lymphoma could also have this appearance. PET/CT scan is recommended for better evaluation. 2. Hepatosplenomegaly with diffuse fatty infiltration of the liver."  On review of systems, pt reports fatigue, abdominal pain, diarrhea, abnormally colored urine, foul smelling urine, unexpected weight loss and denies fevers, chills, rash, night sweats, bloody stools, mucous in stools, headaches, changes in vision, SOB, new lumps/bumps and any other symptoms.   On PMHx the pt reports childhood Asthma, Bipolar disorder, Herniated disc, Spinal stenosis. On Social Hx the pt reports that he is a non-smoker that drinks alcohol occasionally.  On Family Hx the pt reports that his father passed from Prostate Cancer  in his 97's (Agent Orange exposure), he also had Long QT syndrome. His brother has Sarcoidosis.   INTERVAL HISTORY:  Travis Whitney is a wonderful 45 y.o. male who is here for evaluation and management of mesenteric mass. The patient's last visit with [place] was on 02/15/2020. The pt reports that he  is doing well overall.  The pt reports improvement in his diarrhea from the last visit. 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 The pt still experiences some pain in his upper abdomen. The pt has gained weight and not experienced any weight loss. He notes his stools are normal outside of not following his diet.  The pt notes he never had his small bowel follow through as supposed to. He has not heard back from his GI since last visit. The pt notes that he is not currently taking any supplements. He did not take the Iron pills after six weeks on.  Of note since the patient's last visit, pt has had CT Abd Pel w contrast ([id number]) on 07/13/2020, which revealed "Stable mild mesenteric lymphadenopathy. No new or progressive disease within the abdomen or pelvis. Recommend continued follow-up by CT in 6 months. This recommendation follows ACR consensus guidelines: White Paper of the ACR Incidental Findings Committee II on Splenic and Nodal Findings. J Am Coll Radiol [phone]."  Lab results today 07/30/2020 of CBC w/diff and CMP is as follows: all values are WNL except for Hgb of 11.4, HCT of 37.7, MCV of 71.9, Mch of 21.8, RDW of 16.7, Calcium of 8.8, Total Protein of 6.1, ALT of 63, Alkaline Phosphatase of 146.  07/30/2020 Ferritin pending. 07/30/2020 Iron pending.  On review of systems, pt denies constant diarrhea, new lumps/ bumps, fevers, chills, night sweats, abdominal pain, changes in bowel function, back pain, leg swelling and any other symptoms.  MEDICAL HISTORY:  childhood Asthma, Bipolar disorder, Herniated disc, Spinal stenosis.  SURGICAL HISTORY: No past surgical history on file.  SOCIAL HISTORY: Social History   Socioeconomic History  . Marital status: Married    Spouse name: Not on file  . Number of children: Not on file  . Years of education: Not on file  . Highest education level: Not on file  Occupational  History  . Not on file  Tobacco Use  . Smoking status: Never Smoker  . Smokeless tobacco: Not on file  Substance and Sexual Activity  . Alcohol use: Yes  . Drug use: Never  . Sexual activity: Yes    Partners: Female  Other Topics Concern  . Not on file  Social History Narrative  . Not on file   Social Determinants of Health   Financial Resource Strain: Not on file  Food Insecurity: Not on file  Transportation Needs: Not on file  Physical Activity: Not on file  Stress: Not on file  Social Connections: Not on file  Intimate Partner Violence: Not on file    FAMILY HISTORY: No family history on file.  ALLERGIES:  is allergic to shellfish-derived products.  MEDICATIONS:  Current Outpatient Medications  Medication Sig Dispense Refill  . buPROPion (WELLBUTRIN) 75 MG tablet TAKE 2 TABLET BY MOUTH EVERY DAY AND 1 TABLET BY MOUTH EVERY NIGHT 90 tablet 4  . EPINEPHrine 0.3 mg/0.3 mL IJ SOAJ injection SMARTSIG:1 Pre-Filled Pen Syringe SUB-Q Once    . famotidine (PEPCID) 20 MG tablet TK 1 T PO BID PRN 60 tablet 3  . iron polysaccharides (NIFEREX)  150 MG capsule Take 1 capsule (150 mg total) by mouth daily. 30 capsule 5  . lamoTRIgine (LAMICTAL) 200 MG tablet TK 1 T PO BID 60 tablet 3  . LORazepam (ATIVAN) 0.5 MG tablet TAKE 1 TABLET BY MOUTH TWICE DAILY AS NEEDED FOR ANXIETY AND 1 TABLET BY MOUTH EVERY NIGHT AT BEDTIME AS NEEDED FOR INSOMNIA 60 tablet 2  . OXcarbazepine (TRILEPTAL) 150 MG tablet 2 tabs po q day and 3 tabs po q hs 150 tablet 3  . pantoprazole (PROTONIX) 40 MG tablet Take 1 tablet by mouth once daily 150 tablet 5  . potassium chloride SA (KLOR-CON) 20 MEQ tablet 40meq (2tabs) po twice daily for 3 days then 20 meq(1 tab) po twice daily 60 tablet 1  . predniSONE (DELTASONE) 20 MG tablet Take 60 mg by mouth daily.    . QUEtiapine (SEROQUEL) 100 MG tablet One and one half to two tabs po q hs 60 tablet 4  . QUEtiapine (SEROQUEL) 50 MG tablet TAKE 1 TO 2 TABLETS BY MOUTH AT  BEDTIME AS NEEDED 60 tablet 2   No current facility-administered medications for this visit.    REVIEW OF SYSTEMS:   10 Point review of Systems was done is negative except as noted above.  PHYSICAL EXAMINATION: ECOG PERFORMANCE STATUS: 1 - Symptomatic but completely ambulatory  . Vitals:   07/30/20 1400  BP: 136/81  Pulse: 79  Resp: 20  Temp: 98.3 F (36.8 C)  SpO2: 97%   Filed Weights   07/30/20 1400  Weight: 286 lb 14.4 oz (130.1 kg)   .Body mass index is 33.15 kg/m.    GENERAL:alert, in no acute distress and comfortable SKIN: no acute rashes, no significant lesions EYES: conjunctiva are pink and non-injected, sclera anicteric OROPHARYNX: MMM, no exudates, no oropharyngeal erythema or ulceration NECK: supple, no JVD LYMPH:  no palpable lymphadenopathy in the cervical, axillary or inguinal regions LUNGS: clear to auscultation b/l with normal respiratory effort HEART: regular rate & rhythm ABDOMEN:  normoactive bowel sounds , non tender, not distended. Extremity: no pedal edema PSYCH: alert & oriented x 3 with fluent speech NEURO: no focal motor/sensory deficits  LABORATORY DATA:  I have reviewed the data as listed  . CBC Latest Ref Rng & Units 07/30/2020 01/17/2020  WBC 4.0 - 10.5 K/uL 5.7 6.7  Hemoglobin 13.0 - 17.0 g/dL 11.4(L) 11.6(L)  Hematocrit 39.0 - 52.0 % 37.7(L) 38.3(L)  Platelets 150 - 400 K/uL 302 340    . CMP Latest Ref Rng & Units 07/30/2020 02/15/2020 01/17/2020  Glucose 70 - 99 mg/dL 94 - 98  BUN 6 - 20 mg/dL 19 - 10  Creatinine 4.090.61 - 1.24 mg/dL 8.110.98 - 9.140.87  Sodium 782135 - 145 mmol/L 138 - 142  Potassium 3.5 - 5.1 mmol/L 4.2 3.9 2.9(LL)  Chloride 98 - 111 mmol/L 108 - 105  CO2 22 - 32 mmol/L 24 - 28  Calcium 8.9 - 10.3 mg/dL 9.5(A8.8(L) - 7.5(L)  Total Protein 6.5 - 8.1 g/dL 6.1(L) - 5.1(L)  Total Bilirubin 0.3 - 1.2 mg/dL 0.3 - 2.1(H0.2(L)  Alkaline Phos 38 - 126 U/L 146(H) - 167(H)  AST 15 - 41 U/L 32 - 31  ALT 0 - 44 U/L 63(H) - 69(H)      RADIOGRAPHIC STUDIES: I have personally reviewed the radiological images as listed and agreed with the findings in the report. CT Abdomen Pelvis W Contrast  Result Date: 07/05/2020 CLINICAL DATA:  Follow-up mesenteric lymphadenopathy. EXAM: CT ABDOMEN AND PELVIS WITH  CONTRAST TECHNIQUE: Multidetector CT imaging of the abdomen and pelvis was performed using the standard protocol following bolus administration of intravenous contrast. CONTRAST:  OMNIPAQUE IOHEXOL 300 MG/ML  SOLN COMPARISON:  PET-CT on 02/06/2020 FINDINGS: Lower Chest: No acute findings. Hepatobiliary: No hepatic masses identified. Gallbladder is unremarkable. No evidence of biliary ductal dilatation. Pancreas:  No mass or inflammatory changes. Spleen: Within normal limits in size and appearance. Adrenals/Urinary Tract: No masses identified. No evidence of ureteral calculi or hydronephrosis. Stomach/Bowel: No evidence of obstruction, inflammatory process or abnormal fluid collections. Vascular/Lymphatic: Mild mesenteric lymphadenopathy shows no significant change, with index lymph node in the left abdomen measuring 1.4 cm no other sites of lymphadenopathy identified. No abdominal aortic aneurysm. Reproductive:  No mass or other significant abnormality. Other:  None. Musculoskeletal:  No suspicious bone lesions identified. IMPRESSION: Stable mild mesenteric lymphadenopathy. No new or progressive disease within the abdomen or pelvis. Recommend continued follow-up by CT in 6 months. This recommendation follows ACR consensus guidelines: White Paper of the ACR Incidental Findings Committee II on Splenic and Nodal Findings. J Am Coll Radiol [phone]. Electronically Signed   By: Danae Orleans M.D.   On: 07/05/2020 12:20    ASSESSMENT & PLAN:   45 yo with   1) Mesenteric Lymphadenopathy  Likely related to uncontrolled untreated newly diagnosed celiac disease. Cannot r/o low grade lymphoproliferative disorder. Other tumor  markers unrevealing.  PLAN: -Discussed pt labwork today, 07/30/2020; microsytic and mild anemia. Iron labs pending. Other counts stable. -Discussed pt CT Abd Pel w contrast ([id number]) on 07/13/2020; no new findings--stable. -Advised pt that chronic irritation due to untreated celiac disease can cause low-grade lymphoma. -Advised pt that findings on recent CT help to rule out progressive lymphoma and is reassuring due to normal spleen. -Discussed optimizing anemia and potential need for IV iron. The pt is agreeable to this. -Discussed celiac disease and iron absorption. May take 6+ months to replenish iron levels given decreased absorption. -Discussed transfer of care to location closer to pt. The pt wishes to continue care here at this time. -Recommended pt f/u w GI ASAP regarding small bowel follow through. The pt is agreeable to this. -Recommended pt do meal planning and food prep when aware of long-distance travel for work. -Recommended pt start taking Vitamin B-Complex daily. -Recommend pt continue 20 meq Potassium BID.  -Will get IV iron Injectafer x 2 in 1 week. -Will see back in 6 months with labs.  2. Hypokalemia -- resolved with po potassium and improvement of diarrhea on gluten free diet.  3. Severe iron deficiency with mild anemia . Lab Results  Component Value Date   IRON 14 (L) 02/15/2020   TIBC 375 02/15/2020   IRONPCTSAT 4 (L) 02/15/2020   (Iron and TIBC)  Lab Results  Component Value Date   FERRITIN <4 (L) 02/15/2020   Hgb . Lab Results  Component Value Date   HGB 11.4 (L) 07/30/2020   PLAN -discussed IV iron -- patient is agreeable with this will rec FOLLOW UP: -IV Injectafer weekly x 2 doses -patient will call and f/u with Dr Doylene Canning GI for SBFT -RTC with Dr Candise Che with labs in 6 months   The total time spent in the appointment was 20 minutes and more than 50% was on counseling and direct patient cares.  All of the patient's questions were answered  with apparent satisfaction. The patient knows to call the clinic with any problems, questions or concerns.    Wyvonnia Lora MD MS AAHIVMS  [facility] Hematology/Oncology Physician [facility]  (Office):       [phone] (Work cell):  [phone] (Fax):           [phone]  07/30/2020 2:23 PM  I, Minda Meo, am acting as scribe for Dr. Wyvonnia Lora, MD.    .I have reviewed the above documentation for accuracy and completeness, and I agree with the above. Johney Maine MD

## 2020-07-30 ENCOUNTER — Inpatient Hospital Stay: Payer: BC Managed Care – PPO | Attending: Hematology

## 2020-07-30 ENCOUNTER — Other Ambulatory Visit: Payer: Self-pay

## 2020-07-30 ENCOUNTER — Inpatient Hospital Stay: Payer: BC Managed Care – PPO | Admitting: Hematology

## 2020-07-30 VITALS — BP 136/81 | HR 79 | Temp 98.3°F | Resp 20 | Ht 78.0 in | Wt 286.9 lb

## 2020-07-30 DIAGNOSIS — R590 Localized enlarged lymph nodes: Secondary | ICD-10-CM

## 2020-07-30 DIAGNOSIS — Z79899 Other long term (current) drug therapy: Secondary | ICD-10-CM | POA: Diagnosis not present

## 2020-07-30 DIAGNOSIS — E611 Iron deficiency: Secondary | ICD-10-CM

## 2020-07-30 DIAGNOSIS — D509 Iron deficiency anemia, unspecified: Secondary | ICD-10-CM | POA: Diagnosis not present

## 2020-07-30 DIAGNOSIS — E876 Hypokalemia: Secondary | ICD-10-CM | POA: Insufficient documentation

## 2020-07-30 DIAGNOSIS — F319 Bipolar disorder, unspecified: Secondary | ICD-10-CM | POA: Diagnosis not present

## 2020-07-30 DIAGNOSIS — R635 Abnormal weight gain: Secondary | ICD-10-CM | POA: Insufficient documentation

## 2020-07-30 DIAGNOSIS — N4340 Spermatocele of epididymis, unspecified: Secondary | ICD-10-CM | POA: Diagnosis not present

## 2020-07-30 DIAGNOSIS — R109 Unspecified abdominal pain: Secondary | ICD-10-CM | POA: Diagnosis not present

## 2020-07-30 DIAGNOSIS — R1909 Other intra-abdominal and pelvic swelling, mass and lump: Secondary | ICD-10-CM | POA: Diagnosis not present

## 2020-07-30 DIAGNOSIS — R1031 Right lower quadrant pain: Secondary | ICD-10-CM | POA: Insufficient documentation

## 2020-07-30 DIAGNOSIS — K900 Celiac disease: Secondary | ICD-10-CM | POA: Insufficient documentation

## 2020-07-30 LAB — CBC WITH DIFFERENTIAL/PLATELET
Abs Immature Granulocytes: 0.02 10*3/uL (ref 0.00–0.07)
Basophils Absolute: 0.1 10*3/uL (ref 0.0–0.1)
Basophils Relative: 1 %
Eosinophils Absolute: 0.3 10*3/uL (ref 0.0–0.5)
Eosinophils Relative: 5 %
HCT: 37.7 % — ABNORMAL LOW (ref 39.0–52.0)
Hemoglobin: 11.4 g/dL — ABNORMAL LOW (ref 13.0–17.0)
Immature Granulocytes: 0 %
Lymphocytes Relative: 18 %
Lymphs Abs: 1 10*3/uL (ref 0.7–4.0)
MCH: 21.8 pg — ABNORMAL LOW (ref 26.0–34.0)
MCHC: 30.2 g/dL (ref 30.0–36.0)
MCV: 71.9 fL — ABNORMAL LOW (ref 80.0–100.0)
Monocytes Absolute: 0.5 10*3/uL (ref 0.1–1.0)
Monocytes Relative: 9 %
Neutro Abs: 3.8 10*3/uL (ref 1.7–7.7)
Neutrophils Relative %: 67 %
Platelets: 302 10*3/uL (ref 150–400)
RBC: 5.24 MIL/uL (ref 4.22–5.81)
RDW: 16.7 % — ABNORMAL HIGH (ref 11.5–15.5)
WBC: 5.7 10*3/uL (ref 4.0–10.5)
nRBC: 0 % (ref 0.0–0.2)

## 2020-07-30 LAB — CMP (CANCER CENTER ONLY)
ALT: 63 U/L — ABNORMAL HIGH (ref 0–44)
AST: 32 U/L (ref 15–41)
Albumin: 3.6 g/dL (ref 3.5–5.0)
Alkaline Phosphatase: 146 U/L — ABNORMAL HIGH (ref 38–126)
Anion gap: 6 (ref 5–15)
BUN: 19 mg/dL (ref 6–20)
CO2: 24 mmol/L (ref 22–32)
Calcium: 8.8 mg/dL — ABNORMAL LOW (ref 8.9–10.3)
Chloride: 108 mmol/L (ref 98–111)
Creatinine: 0.98 mg/dL (ref 0.61–1.24)
GFR, Estimated: >60 mL/min (ref >60)
Glucose, Bld: 94 mg/dL (ref 70–99)
Potassium: 4.2 mmol/L (ref 3.5–5.1)
Sodium: 138 mmol/L (ref 135–145)
Total Bilirubin: 0.3 mg/dL (ref 0.3–1.2)
Total Protein: 6.1 g/dL — ABNORMAL LOW (ref 6.5–8.1)

## 2020-07-30 LAB — IRON AND TIBC
Iron: 19 ug/dL — ABNORMAL LOW (ref 42–163)
Saturation Ratios: 5 % — ABNORMAL LOW (ref 20–55)
TIBC: 375 ug/dL (ref 202–409)
UIBC: 356 ug/dL (ref 117–376)

## 2020-07-30 LAB — FERRITIN: Ferritin: 4 ng/mL — ABNORMAL LOW (ref 24–336)

## 2020-08-03 ENCOUNTER — Other Ambulatory Visit: Payer: Self-pay | Admitting: Hematology

## 2020-08-03 ENCOUNTER — Telehealth: Payer: Self-pay | Admitting: Hematology

## 2020-08-03 DIAGNOSIS — D500 Iron deficiency anemia secondary to blood loss (chronic): Secondary | ICD-10-CM | POA: Insufficient documentation

## 2020-08-03 NOTE — Telephone Encounter (Signed)
Scheduled per 02/14 los, patient has been called and voicemail was left. 

## 2020-08-09 ENCOUNTER — Other Ambulatory Visit: Payer: Self-pay

## 2020-08-09 ENCOUNTER — Inpatient Hospital Stay: Payer: BC Managed Care – PPO

## 2020-08-09 VITALS — BP 110/80 | HR 84 | Temp 98.1°F | Resp 18 | Ht 78.0 in | Wt 283.2 lb

## 2020-08-09 DIAGNOSIS — D500 Iron deficiency anemia secondary to blood loss (chronic): Secondary | ICD-10-CM

## 2020-08-09 DIAGNOSIS — R1909 Other intra-abdominal and pelvic swelling, mass and lump: Secondary | ICD-10-CM | POA: Diagnosis not present

## 2020-08-09 MED ORDER — ACETAMINOPHEN 325 MG PO TABS
ORAL_TABLET | ORAL | Status: AC
  Filled 2020-08-09: qty 2

## 2020-08-09 MED ORDER — LORATADINE 10 MG PO TABS
ORAL_TABLET | ORAL | Status: AC
  Filled 2020-08-09: qty 1

## 2020-08-09 MED ORDER — METHYLPREDNISOLONE SODIUM SUCC 125 MG IJ SOLR
INTRAMUSCULAR | Status: AC
  Filled 2020-08-09: qty 2

## 2020-08-09 MED ORDER — ACETAMINOPHEN 325 MG PO TABS
650.0000 mg | ORAL_TABLET | Freq: Once | ORAL | Status: AC
  Administered 2020-08-09: 650 mg via ORAL

## 2020-08-09 MED ORDER — SODIUM CHLORIDE 0.9 % IV SOLN
300.0000 mg | Freq: Once | INTRAVENOUS | Status: AC
  Administered 2020-08-09: 300 mg via INTRAVENOUS
  Filled 2020-08-09: qty 10

## 2020-08-09 MED ORDER — LORATADINE 10 MG PO TABS
10.0000 mg | ORAL_TABLET | Freq: Once | ORAL | Status: AC
Start: 2020-08-09 — End: 2020-08-09
  Administered 2020-08-09: 10 mg via ORAL

## 2020-08-09 MED ORDER — METHYLPREDNISOLONE SODIUM SUCC 125 MG IJ SOLR
60.0000 mg | Freq: Once | INTRAMUSCULAR | Status: AC
  Administered 2020-08-09: 60 mg via INTRAVENOUS

## 2020-08-09 MED ORDER — SODIUM CHLORIDE 0.9 % IV SOLN
Freq: Once | INTRAVENOUS | Status: AC
  Filled 2020-08-09: qty 250

## 2020-08-09 NOTE — Patient Instructions (Signed)

## 2020-08-16 ENCOUNTER — Other Ambulatory Visit: Payer: Self-pay

## 2020-08-16 ENCOUNTER — Inpatient Hospital Stay: Payer: BC Managed Care – PPO | Attending: Hematology

## 2020-08-16 VITALS — BP 118/69 | HR 79 | Temp 98.5°F | Resp 18 | Wt 289.5 lb

## 2020-08-16 DIAGNOSIS — R1909 Other intra-abdominal and pelvic swelling, mass and lump: Secondary | ICD-10-CM | POA: Diagnosis not present

## 2020-08-16 DIAGNOSIS — R590 Localized enlarged lymph nodes: Secondary | ICD-10-CM | POA: Insufficient documentation

## 2020-08-16 DIAGNOSIS — D509 Iron deficiency anemia, unspecified: Secondary | ICD-10-CM | POA: Insufficient documentation

## 2020-08-16 DIAGNOSIS — Z79899 Other long term (current) drug therapy: Secondary | ICD-10-CM | POA: Diagnosis not present

## 2020-08-16 DIAGNOSIS — E876 Hypokalemia: Secondary | ICD-10-CM | POA: Diagnosis not present

## 2020-08-16 DIAGNOSIS — D500 Iron deficiency anemia secondary to blood loss (chronic): Secondary | ICD-10-CM

## 2020-08-16 MED ORDER — ACETAMINOPHEN 325 MG PO TABS
ORAL_TABLET | ORAL | Status: AC
  Filled 2020-08-16: qty 2

## 2020-08-16 MED ORDER — IRON SUCROSE 20 MG/ML IV SOLN
300.0000 mg | Freq: Once | INTRAVENOUS | Status: AC
  Administered 2020-08-16: 300 mg via INTRAVENOUS
  Filled 2020-08-16: qty 10

## 2020-08-16 MED ORDER — SODIUM CHLORIDE 0.9 % IV SOLN
Freq: Once | INTRAVENOUS | Status: AC
  Filled 2020-08-16: qty 250

## 2020-08-16 MED ORDER — METHYLPREDNISOLONE SODIUM SUCC 125 MG IJ SOLR
60.0000 mg | Freq: Once | INTRAMUSCULAR | Status: AC
Start: 2020-08-16 — End: 2020-08-16
  Administered 2020-08-16: 60 mg via INTRAVENOUS

## 2020-08-16 MED ORDER — LORATADINE 10 MG PO TABS
ORAL_TABLET | ORAL | Status: AC
  Filled 2020-08-16: qty 1

## 2020-08-16 MED ORDER — ACETAMINOPHEN 325 MG PO TABS
650.0000 mg | ORAL_TABLET | Freq: Once | ORAL | Status: AC
Start: 2020-08-16 — End: 2020-08-16
  Administered 2020-08-16: 650 mg via ORAL

## 2020-08-16 MED ORDER — METHYLPREDNISOLONE SODIUM SUCC 125 MG IJ SOLR
INTRAMUSCULAR | Status: AC
  Filled 2020-08-16: qty 2

## 2020-08-16 MED ORDER — LORATADINE 10 MG PO TABS
10.0000 mg | ORAL_TABLET | Freq: Once | ORAL | Status: AC
  Administered 2020-08-16: 10 mg via ORAL

## 2020-08-16 NOTE — Progress Notes (Signed)
Patient declined to stay for 30 minute post infusion observation. Vitals stable upon discharge, no complaints.

## 2020-08-28 ENCOUNTER — Other Ambulatory Visit: Payer: Self-pay

## 2020-08-28 ENCOUNTER — Telehealth (INDEPENDENT_AMBULATORY_CARE_PROVIDER_SITE_OTHER): Payer: BC Managed Care – PPO | Admitting: Psychiatry

## 2020-08-28 DIAGNOSIS — F3130 Bipolar disorder, current episode depressed, mild or moderate severity, unspecified: Secondary | ICD-10-CM

## 2020-08-28 MED ORDER — FAMOTIDINE 20 MG PO TABS: ORAL_TABLET | ORAL | 3 refills | Status: AC

## 2020-08-28 MED ORDER — PANTOPRAZOLE SODIUM 40 MG PO TBEC: DELAYED_RELEASE_TABLET | ORAL | 5 refills | Status: AC

## 2020-08-28 MED ORDER — QUETIAPINE FUMARATE 50 MG PO TABS: 50.0000 mg | ORAL_TABLET | Freq: Every evening | ORAL | 2 refills | Status: AC | PRN

## 2020-08-28 MED ORDER — BUPROPION HCL 75 MG PO TABS: ORAL_TABLET | ORAL | 4 refills | Status: DC

## 2020-08-28 MED ORDER — LAMOTRIGINE 200 MG PO TABS: ORAL_TABLET | ORAL | 3 refills | Status: AC

## 2020-08-28 MED ORDER — OXCARBAZEPINE 150 MG PO TABS: ORAL_TABLET | ORAL | 3 refills | Status: AC

## 2020-08-28 MED ORDER — BUPROPION HCL 75 MG PO TABS: ORAL_TABLET | ORAL | 4 refills | Status: AC

## 2020-08-28 NOTE — Progress Notes (Signed)
BH MD/PA/NP OP Progress Note  08/28/2020 1:45 PM Travis Whitney  MRN:  [id number]  Chief Complaint:  olar disorder, generalized anxiety disorder, insomnia, diarrhea, weight loss, mesenteric inflammation with lymphadenopathy, anemia, celiac disease HPI: Patient is seen and examined. Patient is a 45 year old male with the above-stated past medical and psychiatric history who is seen in follow-up. This is a telephone visit today and a 2 factor identification process was done. Greater than 50% of the 20-minute visit was spent on directpatient care and evaluation. The evaluation was done from my office and he was in his home.  Patient gave verbal authorization for the phone call visit.  Patient stated he was doing well.  He stated that he had seen the oncologist for his anemia and celiac disease as well as his mesenteric inflammation.  The oral iron was not helping his anemia as as well as had been expected, so he recently had to iron infusions.  He stated he did well with the first 1, but had some significant nausea after the last 1 yesterday.  I reviewed his numbers and they looked low normal.  He stated his mood is stated well.  He stated that he has had some problems emotionally because his wife had been noncompliant with her depression medicine, and as well his stepdaughter continues to have problems with compliance with her diabetic medications.  He stated his youngest son is doing well.  He stated that he is kind of coping with the idea that there is only so much that he can do to get his wife and stepdaughter to recognize the need for treatment.  This is better than he has been.  His sleep is adequate with his current medications.  No racing thoughts, no pressured speech.  Sleep is good, no suicidal or homicidal ideation.  It sounds like he is not working as much as he had been.  No side effects to his current medications he is aware of.  Visit Diagnosis:    ICD-10-CM   1. Bipolar I disorder, most  recent episode depressed (HCC)  F31.30 buPROPion (WELLBUTRIN) 75 MG tablet    DISCONTINUED: buPROPion (WELLBUTRIN) 75 MG tablet    Past Psychiatric History: See initial evaluation.  Past Medical History: No past medical history on file. No past surgical history on file.  Family Psychiatric History: See initial evaluation.  Family History: No family history on file.  Social History:  Social History   Socioeconomic History  . Marital status: Married    Spouse name: Not on file  . Number of children: Not on file  . Years of education: Not on file  . Highest education level: Not on file  Occupational History  . Not on file  Tobacco Use  . Smoking status: Never Smoker  . Smokeless tobacco: Not on file  Substance and Sexual Activity  . Alcohol use: Yes  . Drug use: Never  . Sexual activity: Yes    Partners: Female  Other Topics Concern  . Not on file  Social History Narrative  . Not on file   Social Determinants of Health   Financial Resource Strain: Not on file  Food Insecurity: Not on file  Transportation Needs: Not on file  Physical Activity: Not on file  Stress: Not on file  Social Connections: Not on file    Allergies:  Allergies  Allergen Reactions  . Shellfish-Derived Products Anaphylaxis    Metabolic Disorder Labs: No results found for: HGBA1C, MPG No results found for: PROLACTIN No  results found for: CHOL, TRIG, HDL, CHOLHDL, VLDL, LDLCALC No results found for: TSH  Therapeutic Level Labs: No results found for: LITHIUM No results found for: VALPROATE No components found for:  CBMZ  Current Medications: Current Outpatient Medications  Medication Sig Dispense Refill  . buPROPion (WELLBUTRIN) 75 MG tablet TAKE 2 TABLET BY MOUTH EVERY DAY AND 1 TABLET BY MOUTH EVERY NIGHT 90 tablet 4  . EPINEPHrine 0.3 mg/0.3 mL IJ SOAJ injection SMARTSIG:1 Pre-Filled Pen Syringe SUB-Q Once    . famotidine (PEPCID) 20 MG tablet TK 1 T PO BID PRN 60 tablet 3  . iron  polysaccharides (NIFEREX) 150 MG capsule Take 1 capsule (150 mg total) by mouth daily. 30 capsule 5  . lamoTRIgine (LAMICTAL) 200 MG tablet TK 1 T PO BID 60 tablet 3  . LORazepam (ATIVAN) 0.5 MG tablet TAKE 1 TABLET BY MOUTH TWICE DAILY AS NEEDED FOR ANXIETY AND 1 TABLET BY MOUTH EVERY NIGHT AT BEDTIME AS NEEDED FOR INSOMNIA 60 tablet 2  . OXcarbazepine (TRILEPTAL) 150 MG tablet 2 tabs po q day and 3 tabs po q hs 150 tablet 3  . pantoprazole (PROTONIX) 40 MG tablet Take 1 tablet by mouth once daily 150 tablet 5  . potassium chloride SA (KLOR-CON) 20 MEQ tablet (2tabs) po twice daily for 3 days then 20 meq(1 tab) po twice daily 60 tablet 1  . predniSONE (DELTASONE) 20 MG tablet Take 60 mg by mouth daily.    . QUEtiapine (SEROQUEL) 100 MG tablet One and one half to two tabs po q hs 60 tablet 4  . QUEtiapine (SEROQUEL) 50 MG tablet Take 1-4 tablets (50-200 mg total) by mouth at bedtime as needed. 120 tablet 2   No current facility-administered medications for this visit.     Musculoskeletal: Strength & Muscle Tone: NA Gait & Station: NA Patient leans: N/A  Psychiatric Specialty Exam: Review of Systems  There were no vitals taken for this visit.There is no height or weight on file to calculate BMI.  General Appearance: NA  Eye Contact:  NA  Speech:  Normal Rate  Volume:  Normal  Mood:  Euthymic  Affect:  Congruent  Thought Process:  Coherent and Descriptions of Associations: Intact  Orientation:  Full (Time, Place, and Person)  Thought Content: Logical   Suicidal Thoughts:  No  Homicidal Thoughts:  No  Memory:  Immediate;   Good Recent;   Good Remote;   Good  Judgement:  Intact  Insight:  Fair  Psychomotor Activity:  NA  Concentration:  Concentration: Good and Attention Span: Good  Recall:  Good  Fund of Knowledge: Good  Language: Good  Akathisia:  Negative  Handed:  Right  AIMS (if indicated): not done  Assets:  Communication Skills Desire for Improvement Financial  Resources/Insurance Housing Intimacy Resilience Social Support Talents/Skills Transportation Vocational/Educational  ADL's:  Intact  Cognition: WNL  Sleep:  Good   Screenings:  Diagnosis: 1.  Bipolar disorder, most recently depressed, severe without psychotic features partially resolved 2.  GERD 3.  Celiac disease 4.  Anemia  Assessment and Plan: Patient is seen and examined.  Patient is a 45 year old male with the above-stated past psychiatric history seen in follow-up.  He is doing well.  Sleep is good.  No evidence of mania, hypomania or depression.  No change in his medications.  I will see him back in 3 months.  Plan: 1. ContinueSeroquel to 100 mg 1and one half to2 tabs p.o. nightly for mood stability and  sleep. 2. Continue Trileptal 450 mg p.o. daily and 300 mg p.o. nightly for mood stability and anxiety. 3. Continue short acting Wellbutrin 75 mg 2 tabs p.o. daily and 1 tab p.o. every afternoon for mood and anxiety. 4. Continue Lamictal 200 mg p.o. twice daily for mood stability. 5. Continue Pepcid 20 mg p.o. twice daily for reflux 6. Continue Protonix 40 mg p.o. daily for reflux disease. 7.ContinueAtivan 0.5 mg p.o. BID anxiety as needed and nightly as needed for anxiety and sleep. 8. Follow-up with me in  3 months.Antonieta Pert, MD 08/28/2020, 1:45 PM

## 2021-01-25 ENCOUNTER — Telehealth: Payer: Self-pay | Admitting: Hematology

## 2021-01-25 NOTE — Telephone Encounter (Signed)
Left message with rescheduled upcoming appointment due to provider's emergency. 

## 2021-01-28 ENCOUNTER — Inpatient Hospital Stay: Payer: BC Managed Care – PPO | Admitting: Hematology

## 2021-01-28 ENCOUNTER — Inpatient Hospital Stay: Payer: BC Managed Care – PPO

## 2021-01-31 ENCOUNTER — Other Ambulatory Visit: Payer: Self-pay

## 2021-01-31 DIAGNOSIS — D500 Iron deficiency anemia secondary to blood loss (chronic): Secondary | ICD-10-CM

## 2021-02-01 ENCOUNTER — Inpatient Hospital Stay: Payer: BC Managed Care – PPO

## 2021-02-01 ENCOUNTER — Inpatient Hospital Stay: Payer: BC Managed Care – PPO | Admitting: Hematology

## 2021-02-01 NOTE — Progress Notes (Signed)
Pt did npt show up for appointment today. Will attempt to contact and reschedule.

## 2021-02-04 ENCOUNTER — Telehealth: Payer: Self-pay | Admitting: Hematology

## 2021-02-04 NOTE — Telephone Encounter (Signed)
R/s appts per 8/19 sch  msg. Called pt, no answer. Left msg with appt date and time.

## 2021-02-15 ENCOUNTER — Inpatient Hospital Stay: Payer: BC Managed Care – PPO | Attending: Hematology

## 2021-02-15 ENCOUNTER — Inpatient Hospital Stay: Payer: BC Managed Care – PPO | Admitting: Hematology

## 2022-03-08 IMAGING — CT NM PET TUM IMG INITIAL (PI) SKULL BASE T - THIGH
7 series · 25 of 25 positions shown · non-contrast
Comparison: None.

CLINICAL DATA: Initial treatment strategy for adenopathy concerning
for non-Hodgkin's lymphoma.

EXAM:
NUCLEAR MEDICINE PET SKULL BASE TO THIGH
TECHNIQUE: 13.9 mCi F-18 FDG was injected intravenously. Full-ring PET imaging
was performed from the skull base to thigh after the radiotracer. CT
data was obtained and used for attenuation correction and anatomic
localization.
Fasting blood glucose: 85 mg/dl

[Series 3: pet sk_thigh ac · axial · 5.0mm · 4.07mm/px · z∈[-1552,-596]mm · 6 of 240 slices shown]
[im 1/240]
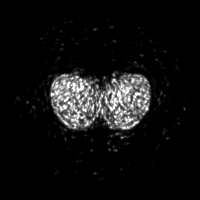
[im 48/240]
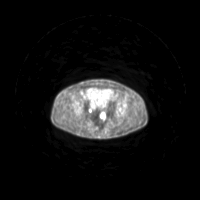
[im 96/240]
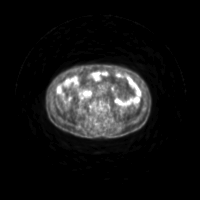
[im 144/240]
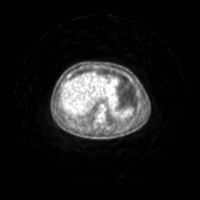
[im 192/240]
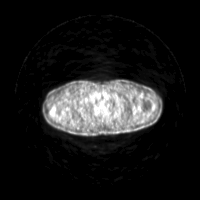
[im 240/240]
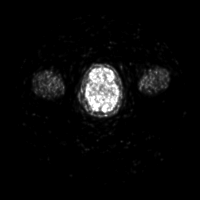

[Series 4: ct sk_thigh 5.0 b31f · axial · 5.0mm · 0.98mm/px · z∈[-1552,-596]mm · 5 of 240 slices shown]
[im 1/240]
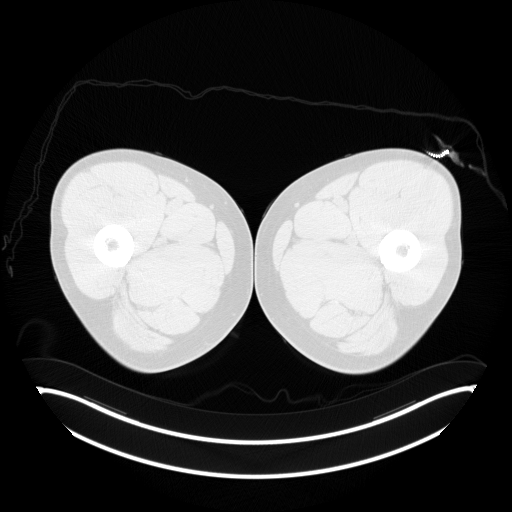
[im 60/240]
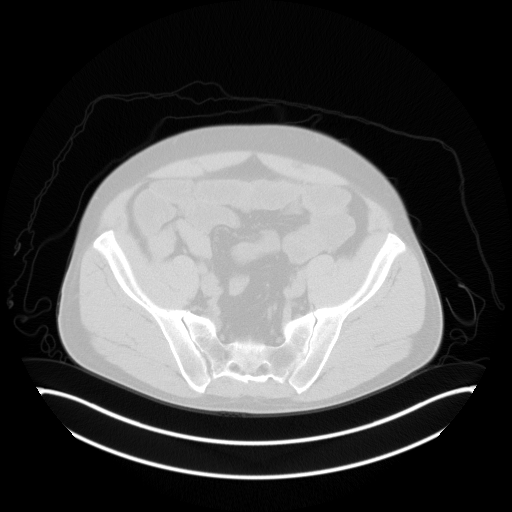
[im 120/240]
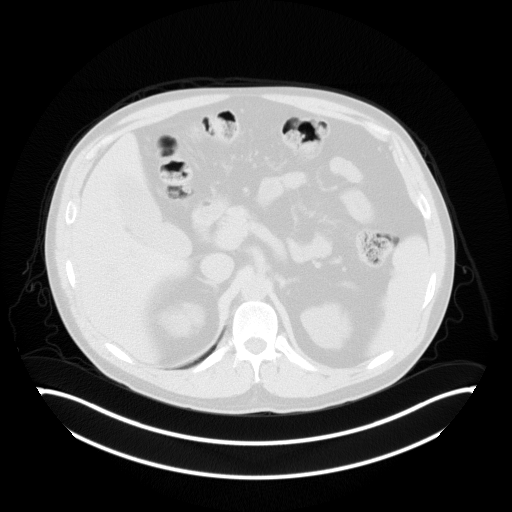
[im 180/240]
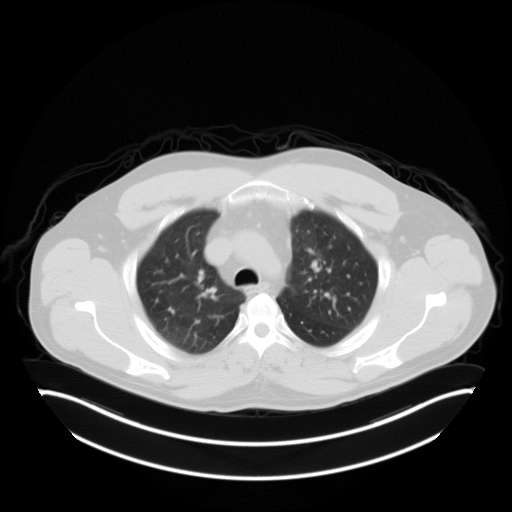
[im 240/240  brain]
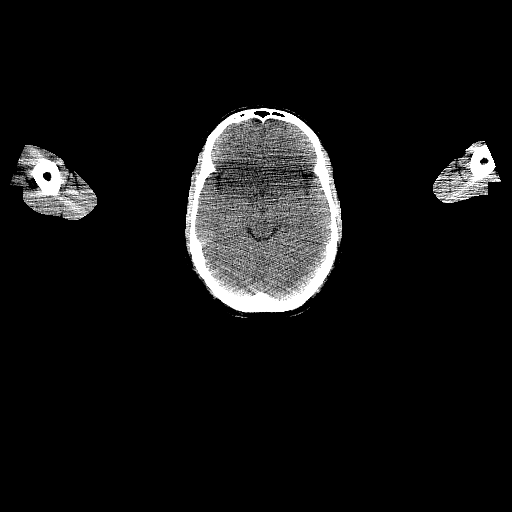

[Series 5: pet sk_thigh nac · axial · 5.0mm · 4.07mm/px · z∈[-1552,-596]mm · 5 of 240 slices shown]
[im 1/240]
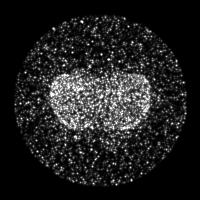
[im 60/240]
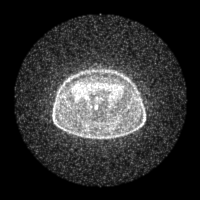
[im 120/240]
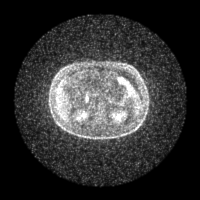
[im 180/240]
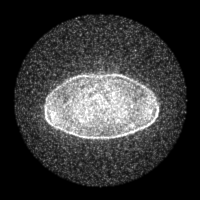
[im 240/240]
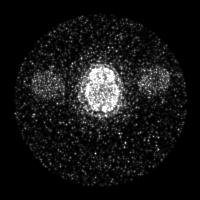

[Series 8: ct sk_thigh 5.0 (id) lung_bone · axial · 5.0mm · 0.73mm/px · 1 of 67 slices shown]
[im 1/67  bone]
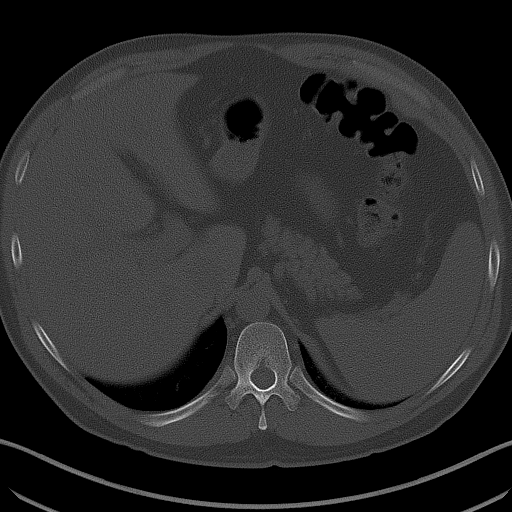

[Series 603: range-ct sk_thigh 5.0 (id)<alpha range> · 2 of 91 slices shown (1 of 2)]
[im 1/91]
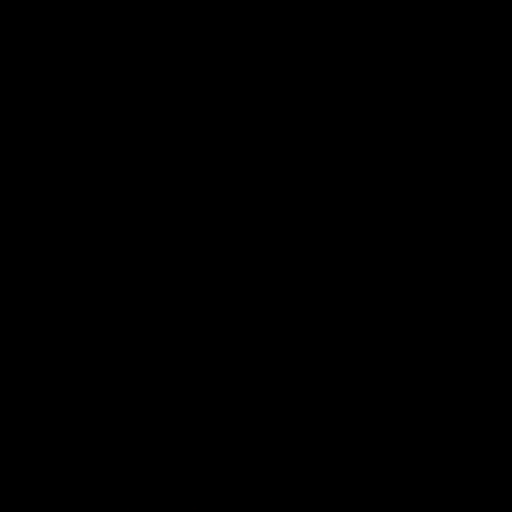
[im 91/91]
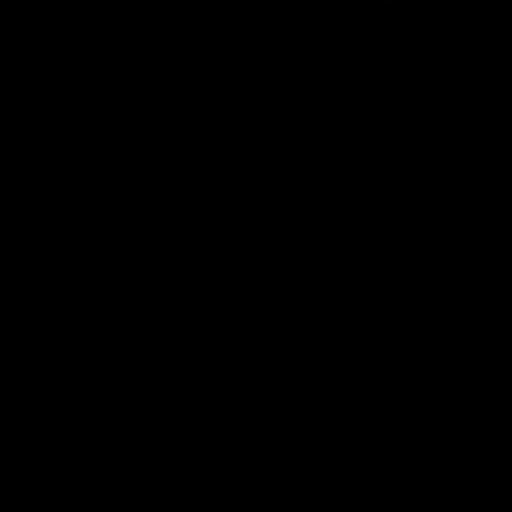

[Series 604: mip range 5 · coronal · 1.98mm/px · 1 of 32 slices shown]
[im 1/32]
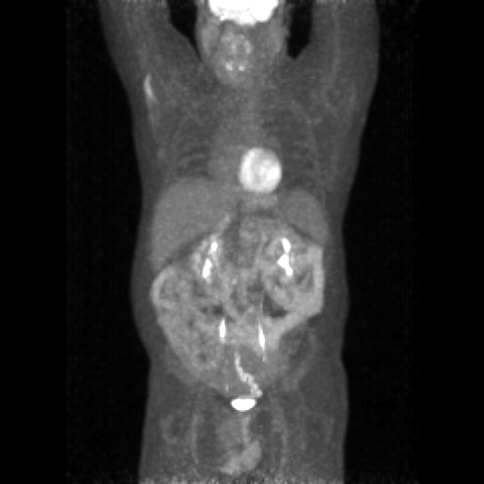

[Series 605: range-ct sk_thigh 5.0 (id)<alpha range> · 5 of 234 slices shown (2 of 2)]
[im 1/234]
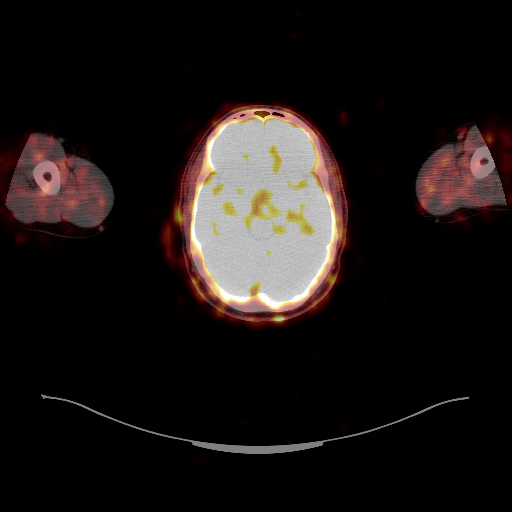
[im 59/234]
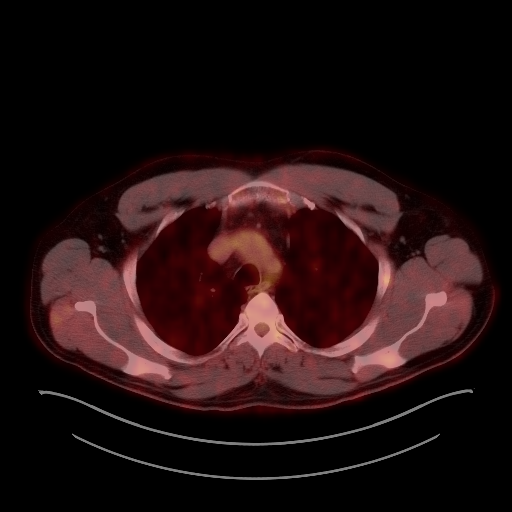
[im 117/234]
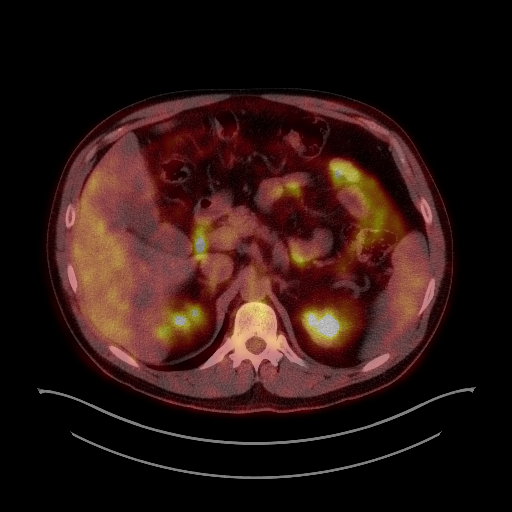
[im 175/234]
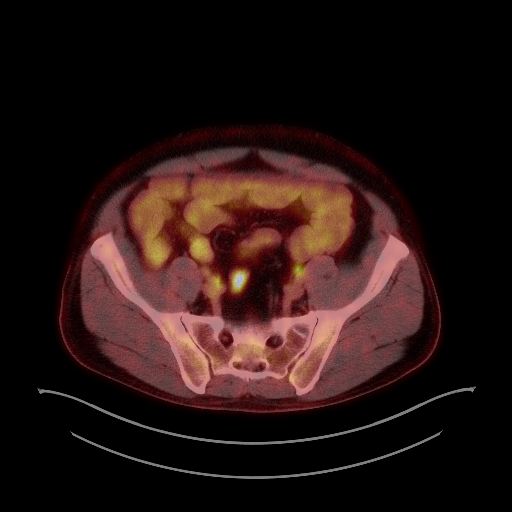
[im 234/234]
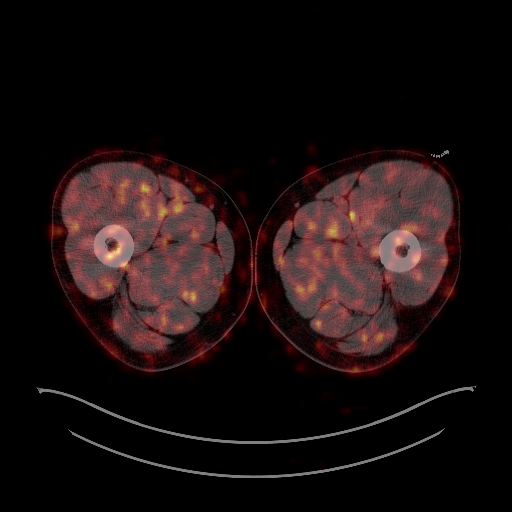

[25 of 25 positions shown; findings below may reference images not displayed]

FINDINGS: Mediastinal blood pool activity: SUV max

Liver activity: SUV max

NECK: Symmetric activity along the palatine tonsils, maximum SUV
on the left 6.1 on the right. No pathologically enlarged adenopathy
in the neck. A 0.7 cm left level IIa lymph node on image [DATE] has
maximum SUV of 2.7, minimally above blood pool, [HOSPITAL] 3.

Incidental CT findings: none

CHEST: No significant abnormal hypermetabolic activity in this
region.

Incidental CT findings: none

ABDOMEN/PELVIS: There are some scattered mesenteric lymph nodes
which are mildly prominent in size and number, including a 1.4 cm in
short axis left upper quadrant mesenteric lymph node on image 140/4
with maximum SUV of 2.5, [HOSPITAL] 3.

Physiologic activity in bowel. Aside from the mesenteric lymph
nodes, no additional significant enlarged nodes are observed in the
abdomen/pelvis.

Incidental CT findings: 3.8 by 2.9 cm fluid density lesion in the
right scrotum, potentially a small hydrocele or spermatocele.

SKELETON: No significant abnormal hypermetabolic activity in this
region.

Incidental CT findings: Lower lumbar spondylosis and degenerative
disc disease with impingement at L4-5.
IMPRESSION: 1. Low-grade [HOSPITAL] 3 activity in the scattered mildly enlarged
mesenteric lymph nodes, maximum SUV of 2.5 which is just above the
mediastinal blood pool activity. This is nonspecific and could
reflect reactive lymph nodes or a low-grade lymphoproliferative
process. I not see a better candidate for biopsy in the neck, chest,
abdomen, or pelvis.
2. Small right scrotal hydrocele versus spermatocele.
3. Lower lumbar spondylosis and degenerative disc disease.

## 2023-06-15 ENCOUNTER — Encounter: Payer: Self-pay | Admitting: Hematology
